# Patient Record
Sex: Male | Born: 1935 | Race: White | Hispanic: No | Smoking: Former smoker
Health system: Southern US, Community
[De-identification: ages and names within clinical notes are randomized; demographics above are authoritative.]

## PROBLEM LIST (undated history)

## (undated) DIAGNOSIS — E785 Hyperlipidemia, unspecified: Secondary | ICD-10-CM

## (undated) DIAGNOSIS — I10 Essential (primary) hypertension: Secondary | ICD-10-CM

## (undated) HISTORY — DX: Hyperlipidemia, unspecified: E78.5

## (undated) HISTORY — DX: Essential (primary) hypertension: I10

---

## 1998-02-09 ENCOUNTER — Encounter (HOSPITAL_COMMUNITY): Admission: RE | Admit: 1998-02-09 | Discharge: 1998-05-10 | Payer: Self-pay | Admitting: Nephrology

## 1998-05-25 ENCOUNTER — Encounter (HOSPITAL_COMMUNITY): Admission: RE | Admit: 1998-05-25 | Discharge: 1998-08-23 | Payer: Self-pay | Admitting: Nephrology

## 1998-08-24 ENCOUNTER — Encounter (HOSPITAL_COMMUNITY): Admission: RE | Admit: 1998-08-24 | Discharge: 1998-11-22 | Payer: Self-pay | Admitting: Nephrology

## 1998-09-15 ENCOUNTER — Encounter: Admission: RE | Admit: 1998-09-15 | Discharge: 1998-09-15 | Payer: Self-pay | Admitting: Sports Medicine

## 1998-12-14 ENCOUNTER — Encounter (HOSPITAL_COMMUNITY): Admission: RE | Admit: 1998-12-14 | Discharge: 1999-03-14 | Payer: Self-pay | Admitting: Nephrology

## 1999-03-22 ENCOUNTER — Encounter (HOSPITAL_COMMUNITY): Admission: RE | Admit: 1999-03-22 | Discharge: 1999-06-20 | Payer: Self-pay | Admitting: Nephrology

## 1999-04-13 ENCOUNTER — Encounter: Admission: RE | Admit: 1999-04-13 | Discharge: 1999-04-13 | Payer: Self-pay | Admitting: Family Medicine

## 1999-05-04 ENCOUNTER — Encounter: Admission: RE | Admit: 1999-05-04 | Discharge: 1999-05-04 | Payer: Self-pay | Admitting: Sports Medicine

## 1999-06-29 ENCOUNTER — Encounter (HOSPITAL_COMMUNITY): Admission: RE | Admit: 1999-06-29 | Discharge: 1999-09-27 | Payer: Self-pay | Admitting: Nephrology

## 1999-10-04 ENCOUNTER — Encounter (HOSPITAL_COMMUNITY): Admission: RE | Admit: 1999-10-04 | Discharge: 2000-01-02 | Payer: Self-pay | Admitting: Nephrology

## 1999-11-23 ENCOUNTER — Encounter: Admission: RE | Admit: 1999-11-23 | Discharge: 1999-11-23 | Payer: Self-pay | Admitting: Sports Medicine

## 1999-11-28 ENCOUNTER — Encounter: Admission: RE | Admit: 1999-11-28 | Discharge: 2000-02-26 | Payer: Self-pay | Admitting: Nephrology

## 2000-01-04 ENCOUNTER — Encounter (HOSPITAL_COMMUNITY): Admission: RE | Admit: 2000-01-04 | Discharge: 2000-04-03 | Payer: Self-pay | Admitting: Nephrology

## 2000-04-17 ENCOUNTER — Encounter (HOSPITAL_COMMUNITY): Admission: RE | Admit: 2000-04-17 | Discharge: 2000-07-16 | Payer: Self-pay | Admitting: Nephrology

## 2000-04-26 ENCOUNTER — Encounter: Admission: RE | Admit: 2000-04-26 | Discharge: 2000-07-25 | Payer: Self-pay | Admitting: Nephrology

## 2000-07-24 ENCOUNTER — Encounter (HOSPITAL_COMMUNITY): Admission: RE | Admit: 2000-07-24 | Discharge: 2000-10-22 | Payer: Self-pay | Admitting: Nephrology

## 2000-09-09 ENCOUNTER — Encounter: Admission: RE | Admit: 2000-09-09 | Discharge: 2000-09-09 | Payer: Self-pay | Admitting: Family Medicine

## 2000-10-10 ENCOUNTER — Encounter: Admission: RE | Admit: 2000-10-10 | Discharge: 2000-10-10 | Payer: Self-pay | Admitting: Sports Medicine

## 2000-10-11 ENCOUNTER — Encounter: Admission: RE | Admit: 2000-10-11 | Discharge: 2000-10-11 | Payer: Self-pay | Admitting: Family Medicine

## 2000-10-30 ENCOUNTER — Encounter (HOSPITAL_COMMUNITY): Admission: RE | Admit: 2000-10-30 | Discharge: 2001-01-28 | Payer: Self-pay | Admitting: Nephrology

## 2001-02-19 ENCOUNTER — Encounter (HOSPITAL_COMMUNITY): Admission: RE | Admit: 2001-02-19 | Discharge: 2001-05-20 | Payer: Self-pay | Admitting: Nephrology

## 2001-05-28 ENCOUNTER — Encounter (HOSPITAL_COMMUNITY): Admission: RE | Admit: 2001-05-28 | Discharge: 2001-06-04 | Payer: Self-pay | Admitting: Nephrology

## 2001-06-06 ENCOUNTER — Encounter (HOSPITAL_COMMUNITY): Admission: RE | Admit: 2001-06-06 | Discharge: 2001-09-04 | Payer: Self-pay | Admitting: Nephrology

## 2001-07-03 ENCOUNTER — Encounter: Admission: RE | Admit: 2001-07-03 | Discharge: 2001-07-03 | Payer: Self-pay | Admitting: Sports Medicine

## 2001-09-17 ENCOUNTER — Encounter (HOSPITAL_COMMUNITY): Admission: RE | Admit: 2001-09-17 | Discharge: 2001-12-16 | Payer: Self-pay | Admitting: Nephrology

## 2001-12-24 ENCOUNTER — Encounter (HOSPITAL_COMMUNITY): Admission: RE | Admit: 2001-12-24 | Discharge: 2002-03-24 | Payer: Self-pay | Admitting: Nephrology

## 2002-02-05 ENCOUNTER — Encounter: Admission: RE | Admit: 2002-02-05 | Discharge: 2002-02-05 | Payer: Self-pay | Admitting: Sports Medicine

## 2002-02-05 ENCOUNTER — Encounter: Admission: RE | Admit: 2002-02-05 | Discharge: 2002-02-05 | Payer: Self-pay | Admitting: Family Medicine

## 2002-03-05 ENCOUNTER — Encounter: Admission: RE | Admit: 2002-03-05 | Discharge: 2002-03-05 | Payer: Self-pay | Admitting: Sports Medicine

## 2002-04-01 ENCOUNTER — Encounter (HOSPITAL_COMMUNITY): Admission: RE | Admit: 2002-04-01 | Discharge: 2002-06-30 | Payer: Self-pay | Admitting: Nephrology

## 2002-06-25 ENCOUNTER — Encounter: Admission: RE | Admit: 2002-06-25 | Discharge: 2002-06-25 | Payer: Self-pay | Admitting: Sports Medicine

## 2002-07-08 ENCOUNTER — Encounter (HOSPITAL_COMMUNITY): Admission: RE | Admit: 2002-07-08 | Discharge: 2002-10-06 | Payer: Self-pay | Admitting: Nephrology

## 2002-07-30 ENCOUNTER — Encounter: Admission: RE | Admit: 2002-07-30 | Discharge: 2002-07-30 | Payer: Self-pay | Admitting: Family Medicine

## 2002-07-31 ENCOUNTER — Encounter: Admission: RE | Admit: 2002-07-31 | Discharge: 2002-07-31 | Payer: Self-pay | Admitting: Sports Medicine

## 2002-09-01 ENCOUNTER — Encounter: Admission: RE | Admit: 2002-09-01 | Discharge: 2002-09-01 | Payer: Self-pay | Admitting: Sports Medicine

## 2002-10-14 ENCOUNTER — Encounter (HOSPITAL_COMMUNITY): Admission: RE | Admit: 2002-10-14 | Discharge: 2003-01-12 | Payer: Self-pay | Admitting: Nephrology

## 2002-11-26 ENCOUNTER — Encounter: Admission: RE | Admit: 2002-11-26 | Discharge: 2002-11-26 | Payer: Self-pay | Admitting: Family Medicine

## 2003-01-27 ENCOUNTER — Encounter (HOSPITAL_COMMUNITY): Admission: RE | Admit: 2003-01-27 | Discharge: 2003-04-27 | Payer: Self-pay | Admitting: Nephrology

## 2003-04-01 ENCOUNTER — Encounter: Admission: RE | Admit: 2003-04-01 | Discharge: 2003-04-01 | Payer: Self-pay | Admitting: Sports Medicine

## 2003-05-12 ENCOUNTER — Encounter (HOSPITAL_COMMUNITY): Admission: RE | Admit: 2003-05-12 | Discharge: 2003-08-10 | Payer: Self-pay | Admitting: Nephrology

## 2003-07-02 ENCOUNTER — Encounter: Admission: RE | Admit: 2003-07-02 | Discharge: 2003-07-02 | Payer: Self-pay | Admitting: Sports Medicine

## 2003-08-25 ENCOUNTER — Encounter (HOSPITAL_COMMUNITY): Admission: RE | Admit: 2003-08-25 | Discharge: 2003-11-23 | Payer: Self-pay | Admitting: Nephrology

## 2003-10-20 ENCOUNTER — Encounter: Admission: RE | Admit: 2003-10-20 | Discharge: 2003-10-20 | Payer: Self-pay | Admitting: Family Medicine

## 2003-11-04 ENCOUNTER — Encounter: Admission: RE | Admit: 2003-11-04 | Discharge: 2003-11-04 | Payer: Self-pay | Admitting: Family Medicine

## 2003-12-01 ENCOUNTER — Encounter: Admission: RE | Admit: 2003-12-01 | Discharge: 2003-12-01 | Payer: Self-pay | Admitting: Sports Medicine

## 2003-12-02 ENCOUNTER — Encounter: Admission: RE | Admit: 2003-12-02 | Discharge: 2003-12-02 | Payer: Self-pay | Admitting: Sports Medicine

## 2003-12-07 ENCOUNTER — Encounter (HOSPITAL_COMMUNITY): Admission: RE | Admit: 2003-12-07 | Discharge: 2004-03-06 | Payer: Self-pay | Admitting: Nephrology

## 2003-12-17 ENCOUNTER — Encounter: Admission: RE | Admit: 2003-12-17 | Discharge: 2003-12-17 | Payer: Self-pay | Admitting: Family Medicine

## 2004-03-22 ENCOUNTER — Encounter (HOSPITAL_COMMUNITY): Admission: RE | Admit: 2004-03-22 | Discharge: 2004-06-20 | Payer: Self-pay | Admitting: Nephrology

## 2004-07-07 ENCOUNTER — Encounter (HOSPITAL_COMMUNITY): Admission: RE | Admit: 2004-07-07 | Discharge: 2004-10-05 | Payer: Self-pay | Admitting: Nephrology

## 2004-08-10 ENCOUNTER — Ambulatory Visit: Payer: Self-pay | Admitting: Sports Medicine

## 2004-08-22 ENCOUNTER — Ambulatory Visit: Payer: Self-pay | Admitting: Sports Medicine

## 2004-10-05 ENCOUNTER — Ambulatory Visit: Payer: Self-pay | Admitting: Sports Medicine

## 2004-10-18 ENCOUNTER — Encounter (HOSPITAL_COMMUNITY): Admission: RE | Admit: 2004-10-18 | Discharge: 2005-01-16 | Payer: Self-pay | Admitting: Nephrology

## 2004-11-30 ENCOUNTER — Ambulatory Visit: Payer: Self-pay | Admitting: Sports Medicine

## 2005-01-31 ENCOUNTER — Encounter (HOSPITAL_COMMUNITY): Admission: RE | Admit: 2005-01-31 | Discharge: 2005-05-01 | Payer: Self-pay | Admitting: Nephrology

## 2005-05-16 ENCOUNTER — Encounter (HOSPITAL_COMMUNITY): Admission: RE | Admit: 2005-05-16 | Discharge: 2005-08-14 | Payer: Self-pay | Admitting: Nephrology

## 2005-08-29 ENCOUNTER — Encounter (HOSPITAL_COMMUNITY): Admission: RE | Admit: 2005-08-29 | Discharge: 2005-11-27 | Payer: Self-pay | Admitting: Nephrology

## 2005-08-31 ENCOUNTER — Ambulatory Visit: Payer: Self-pay | Admitting: Sports Medicine

## 2005-10-25 ENCOUNTER — Ambulatory Visit: Payer: Self-pay | Admitting: Sports Medicine

## 2005-12-19 ENCOUNTER — Encounter (HOSPITAL_COMMUNITY): Admission: RE | Admit: 2005-12-19 | Discharge: 2006-03-19 | Payer: Self-pay | Admitting: Nephrology

## 2006-04-10 ENCOUNTER — Encounter (HOSPITAL_COMMUNITY): Admission: RE | Admit: 2006-04-10 | Discharge: 2006-07-09 | Payer: Self-pay | Admitting: Nephrology

## 2006-07-11 ENCOUNTER — Ambulatory Visit: Payer: Self-pay | Admitting: Family Medicine

## 2006-07-31 ENCOUNTER — Encounter (HOSPITAL_COMMUNITY): Admission: RE | Admit: 2006-07-31 | Discharge: 2006-10-28 | Payer: Self-pay | Admitting: Nephrology

## 2006-08-01 ENCOUNTER — Ambulatory Visit: Payer: Self-pay | Admitting: Sports Medicine

## 2006-08-22 ENCOUNTER — Ambulatory Visit: Payer: Self-pay | Admitting: Sports Medicine

## 2006-09-05 ENCOUNTER — Ambulatory Visit: Payer: Self-pay | Admitting: Sports Medicine

## 2006-09-12 ENCOUNTER — Ambulatory Visit: Payer: Self-pay | Admitting: Sports Medicine

## 2006-10-17 ENCOUNTER — Ambulatory Visit: Payer: Self-pay | Admitting: Sports Medicine

## 2006-10-18 ENCOUNTER — Ambulatory Visit (HOSPITAL_COMMUNITY): Admission: RE | Admit: 2006-10-18 | Discharge: 2006-10-18 | Payer: Self-pay | Admitting: Nephrology

## 2006-10-21 ENCOUNTER — Ambulatory Visit (HOSPITAL_COMMUNITY): Admission: RE | Admit: 2006-10-21 | Discharge: 2006-10-21 | Payer: Self-pay | Admitting: Nephrology

## 2006-10-30 ENCOUNTER — Encounter (HOSPITAL_COMMUNITY): Admission: RE | Admit: 2006-10-30 | Discharge: 2007-01-28 | Payer: Self-pay | Admitting: Nephrology

## 2006-12-19 ENCOUNTER — Ambulatory Visit: Payer: Self-pay | Admitting: Sports Medicine

## 2006-12-19 ENCOUNTER — Encounter: Admission: RE | Admit: 2006-12-19 | Discharge: 2006-12-19 | Payer: Self-pay | Admitting: Sports Medicine

## 2007-01-02 DIAGNOSIS — I2510 Atherosclerotic heart disease of native coronary artery without angina pectoris: Secondary | ICD-10-CM | POA: Insufficient documentation

## 2007-01-02 DIAGNOSIS — E785 Hyperlipidemia, unspecified: Secondary | ICD-10-CM | POA: Insufficient documentation

## 2007-01-02 DIAGNOSIS — N19 Unspecified kidney failure: Secondary | ICD-10-CM | POA: Insufficient documentation

## 2007-01-02 DIAGNOSIS — I739 Peripheral vascular disease, unspecified: Secondary | ICD-10-CM

## 2007-01-02 DIAGNOSIS — N529 Male erectile dysfunction, unspecified: Secondary | ICD-10-CM

## 2007-01-02 DIAGNOSIS — I10 Essential (primary) hypertension: Secondary | ICD-10-CM

## 2007-01-29 ENCOUNTER — Encounter (HOSPITAL_COMMUNITY): Admission: RE | Admit: 2007-01-29 | Discharge: 2007-04-29 | Payer: Self-pay | Admitting: Nephrology

## 2007-02-03 ENCOUNTER — Encounter: Payer: Self-pay | Admitting: *Deleted

## 2007-02-03 ENCOUNTER — Telehealth: Payer: Self-pay | Admitting: Sports Medicine

## 2007-02-07 ENCOUNTER — Encounter: Payer: Self-pay | Admitting: Sports Medicine

## 2007-02-13 ENCOUNTER — Ambulatory Visit: Payer: Self-pay | Admitting: Sports Medicine

## 2007-02-13 DIAGNOSIS — M1710 Unilateral primary osteoarthritis, unspecified knee: Secondary | ICD-10-CM

## 2007-02-13 DIAGNOSIS — B029 Zoster without complications: Secondary | ICD-10-CM | POA: Insufficient documentation

## 2007-04-07 ENCOUNTER — Telehealth: Payer: Self-pay | Admitting: *Deleted

## 2007-05-07 ENCOUNTER — Encounter (HOSPITAL_COMMUNITY): Admission: RE | Admit: 2007-05-07 | Discharge: 2007-08-05 | Payer: Self-pay | Admitting: Nephrology

## 2007-05-23 ENCOUNTER — Encounter: Admission: RE | Admit: 2007-05-23 | Discharge: 2007-05-23 | Payer: Self-pay | Admitting: Nephrology

## 2007-06-05 ENCOUNTER — Ambulatory Visit: Payer: Self-pay | Admitting: Sports Medicine

## 2007-06-09 ENCOUNTER — Encounter: Payer: Self-pay | Admitting: Sports Medicine

## 2007-06-19 ENCOUNTER — Telehealth: Payer: Self-pay | Admitting: Sports Medicine

## 2007-07-17 ENCOUNTER — Encounter: Payer: Self-pay | Admitting: Sports Medicine

## 2007-07-28 ENCOUNTER — Telehealth: Payer: Self-pay | Admitting: Sports Medicine

## 2007-08-01 ENCOUNTER — Encounter (INDEPENDENT_AMBULATORY_CARE_PROVIDER_SITE_OTHER): Payer: Self-pay | Admitting: Family Medicine

## 2007-08-01 ENCOUNTER — Telehealth: Payer: Self-pay | Admitting: *Deleted

## 2007-08-01 ENCOUNTER — Ambulatory Visit: Payer: Self-pay | Admitting: Family Medicine

## 2007-08-01 ENCOUNTER — Encounter: Payer: Self-pay | Admitting: Sports Medicine

## 2007-08-01 DIAGNOSIS — R1013 Epigastric pain: Secondary | ICD-10-CM

## 2007-08-01 DIAGNOSIS — R112 Nausea with vomiting, unspecified: Secondary | ICD-10-CM | POA: Insufficient documentation

## 2007-08-01 LAB — CONVERTED CEMR LAB: H Pylori IgG: NEGATIVE

## 2007-08-02 LAB — CONVERTED CEMR LAB
ALT: 8 units/L (ref 0–53)
AST: 11 units/L (ref 0–37)
Albumin: 4 g/dL (ref 3.5–5.2)
Alkaline Phosphatase: 100 units/L (ref 39–117)
BUN: 25 mg/dL — ABNORMAL HIGH (ref 6–23)
Basophils Absolute: 0 10*3/uL (ref 0.0–0.1)
Basophils Relative: 0 % (ref 0–1)
Bilirubin, Direct: <0.1 mg/dL (ref 0.0–0.3)
CO2: 28 meq/L (ref 19–32)
Calcium: 9.4 mg/dL (ref 8.4–10.5)
Chloride: 100 meq/L (ref 96–112)
Creatinine, Ser: 2.43 mg/dL — ABNORMAL HIGH (ref 0.40–1.50)
Eosinophils Absolute: 0.1 10*3/uL (ref 0.0–0.7)
Eosinophils Relative: 1 % (ref 0–5)
Glucose, Bld: 101 mg/dL — ABNORMAL HIGH (ref 70–99)
HCT: 32.5 % — ABNORMAL LOW (ref 39.0–52.0)
Hemoglobin: 9.5 g/dL — ABNORMAL LOW (ref 13.0–17.0)
Lipase: 12 units/L (ref 0–75)
Lymphocytes Relative: 11 % — ABNORMAL LOW (ref 12–46)
Lymphs Abs: 1.2 10*3/uL (ref 0.7–3.3)
MCHC: 29.2 g/dL — ABNORMAL LOW (ref 30.0–36.0)
MCV: 99.4 fL (ref 78.0–100.0)
Monocytes Absolute: 0.7 10*3/uL (ref 0.2–0.7)
Monocytes Relative: 6 % (ref 3–11)
Neutro Abs: 8.6 10*3/uL — ABNORMAL HIGH (ref 1.7–7.7)
Neutrophils Relative %: 82 % — ABNORMAL HIGH (ref 43–77)
Platelets: 573 10*3/uL — ABNORMAL HIGH (ref 150–400)
Potassium: 3.9 meq/L (ref 3.5–5.3)
RBC: 3.27 M/uL — ABNORMAL LOW (ref 4.22–5.81)
RDW: 16.2 % — ABNORMAL HIGH (ref 11.5–14.0)
Sodium: 143 meq/L (ref 135–145)
Total Bilirubin: 0.3 mg/dL (ref 0.3–1.2)
Total Protein: 7.2 g/dL (ref 6.0–8.3)
WBC: 10.5 10*3/uL (ref 4.0–10.5)

## 2007-08-12 ENCOUNTER — Encounter: Payer: Self-pay | Admitting: Sports Medicine

## 2007-08-12 ENCOUNTER — Encounter (HOSPITAL_COMMUNITY): Admission: RE | Admit: 2007-08-12 | Discharge: 2007-11-10 | Payer: Self-pay | Admitting: Nephrology

## 2007-08-14 ENCOUNTER — Ambulatory Visit: Payer: Self-pay | Admitting: Sports Medicine

## 2007-09-01 ENCOUNTER — Telehealth: Payer: Self-pay | Admitting: Sports Medicine

## 2007-09-16 ENCOUNTER — Encounter: Payer: Self-pay | Admitting: Sports Medicine

## 2007-11-19 ENCOUNTER — Encounter (HOSPITAL_COMMUNITY): Admission: RE | Admit: 2007-11-19 | Discharge: 2008-02-17 | Payer: Self-pay | Admitting: Nephrology

## 2007-11-27 ENCOUNTER — Ambulatory Visit: Payer: Self-pay | Admitting: Sports Medicine

## 2007-12-04 ENCOUNTER — Ambulatory Visit: Payer: Self-pay | Admitting: Sports Medicine

## 2007-12-15 ENCOUNTER — Telehealth: Payer: Self-pay | Admitting: *Deleted

## 2007-12-30 ENCOUNTER — Encounter: Payer: Self-pay | Admitting: Sports Medicine

## 2008-01-01 ENCOUNTER — Telehealth: Payer: Self-pay | Admitting: Sports Medicine

## 2008-01-08 ENCOUNTER — Ambulatory Visit: Payer: Self-pay | Admitting: Sports Medicine

## 2008-01-08 DIAGNOSIS — G5600 Carpal tunnel syndrome, unspecified upper limb: Secondary | ICD-10-CM

## 2008-02-10 IMAGING — CR DG KNEE COMPLETE 4+V*R*
4 series · 4 of 4 positions shown · non-contrast
Comparison: [REDACTED], right knee radiographs, 12/02/03.

CLINICAL DATA: Three weeks post fall injury with continued right knee pain and swelling.   Left knee pain.  No injury.
DIAGNOSTIC KNEE COMPLETE - 4 VIEW:

[w knee ap right]
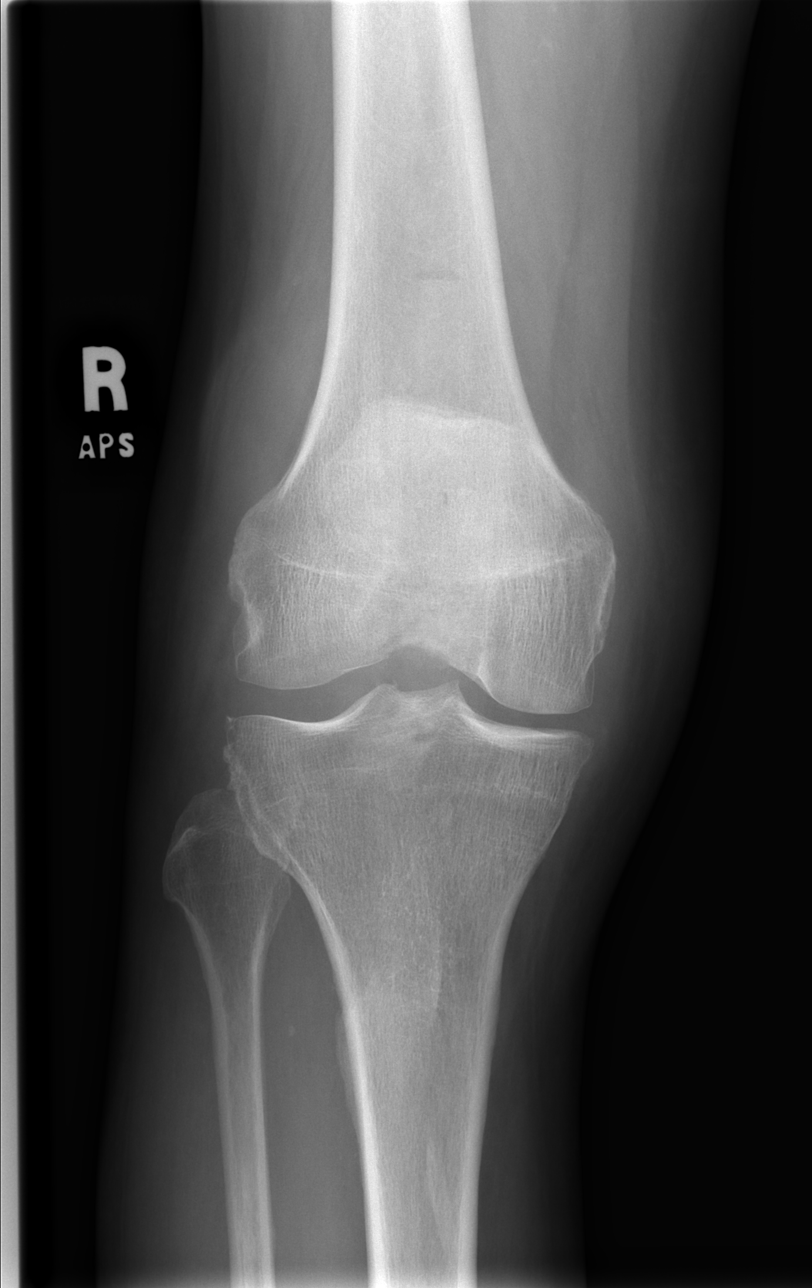

[w knee obl. right (1 of 2)]
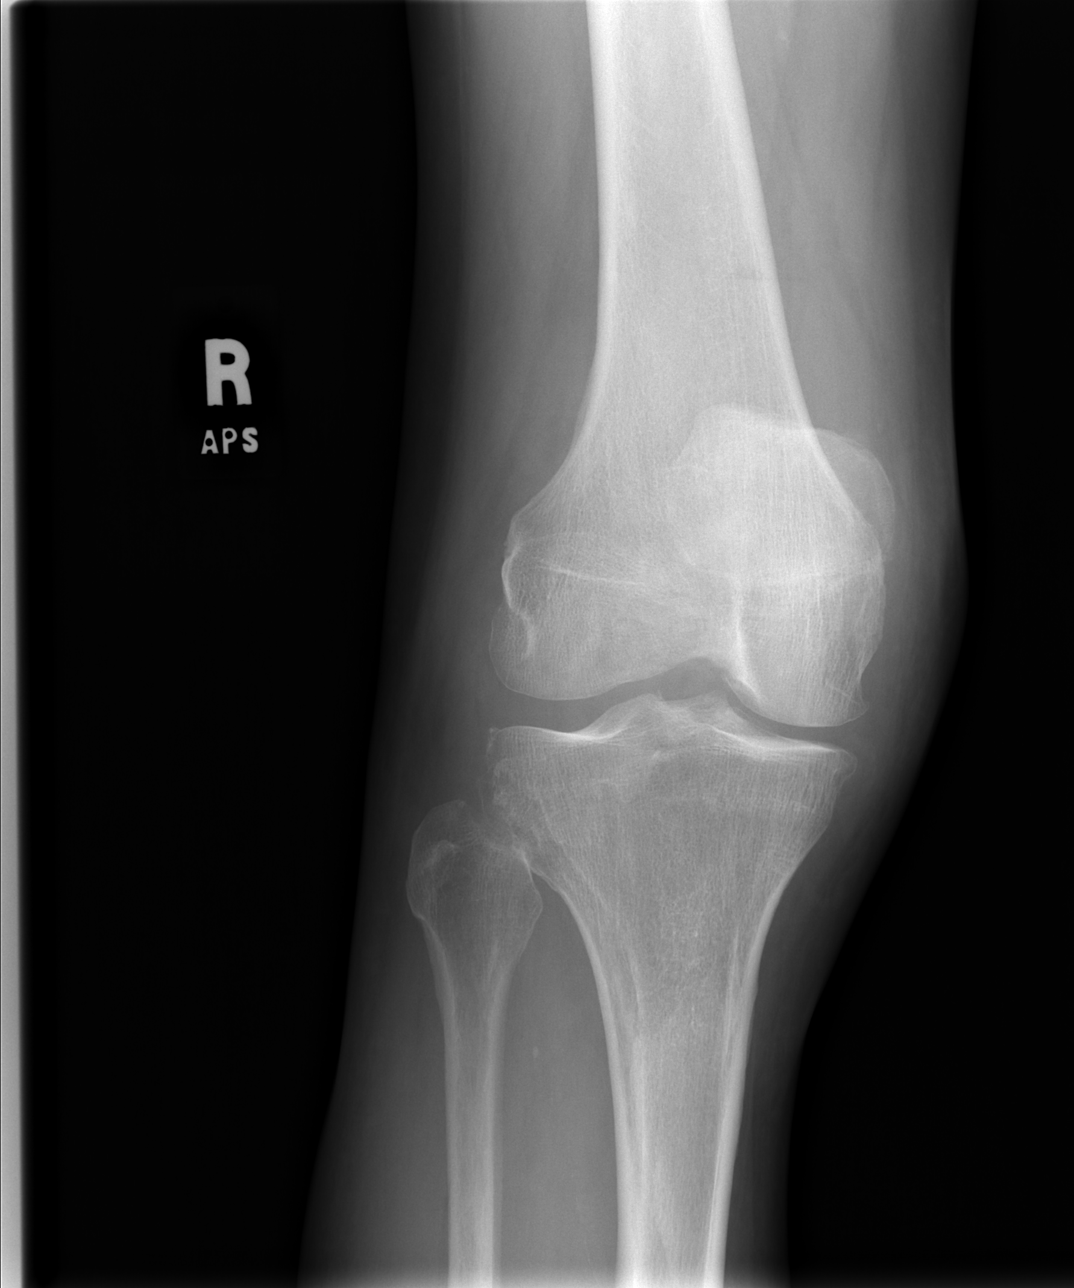

[w knee obl. right (2 of 2)]
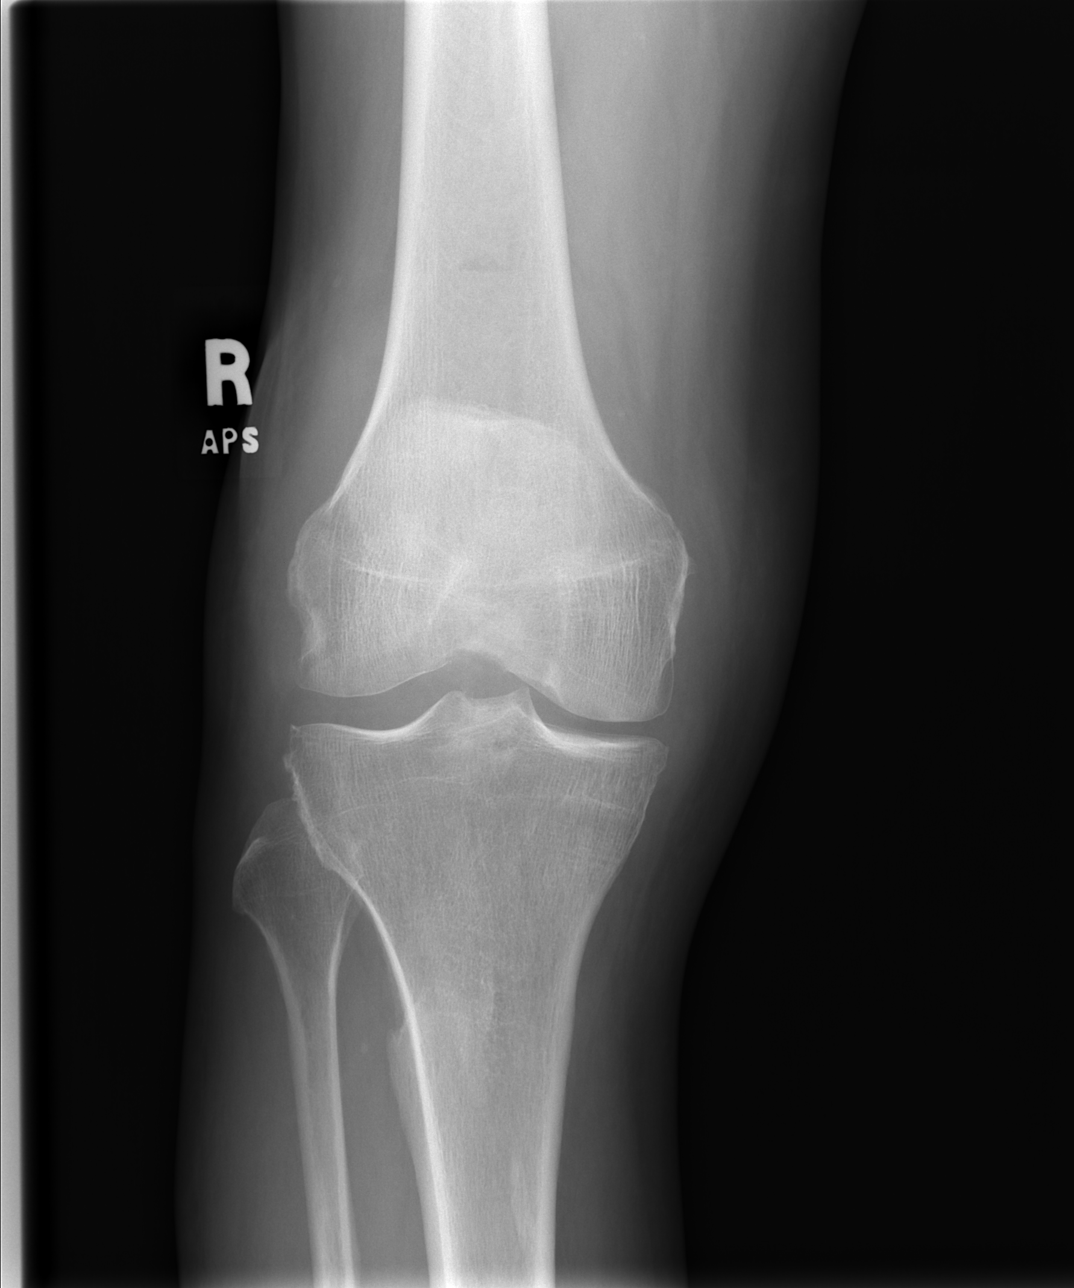

[w knee lat. right]
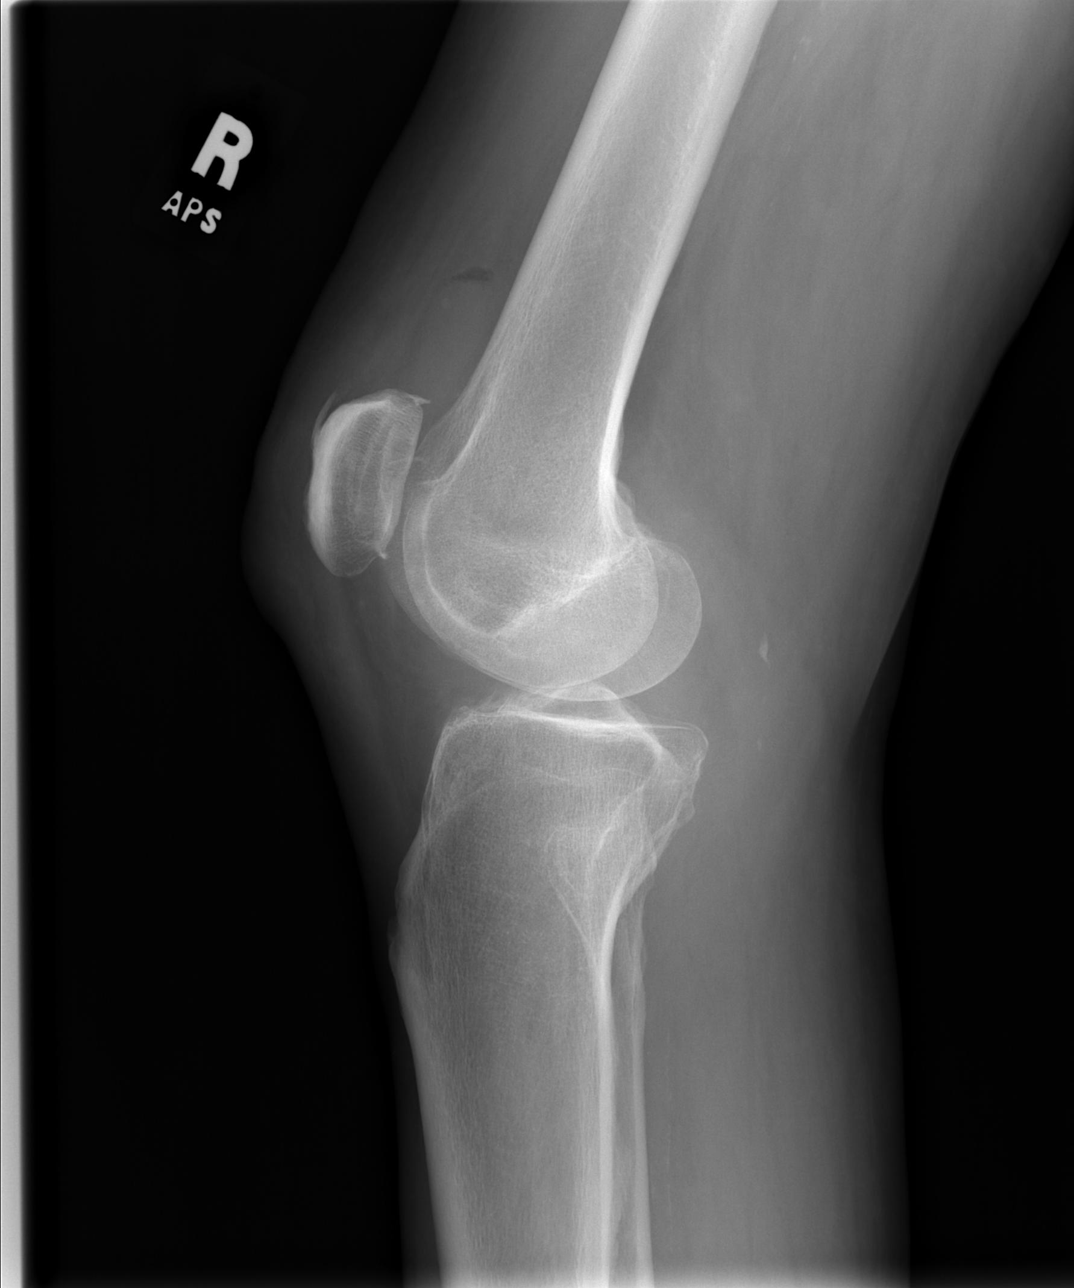

[4 of 4 positions shown; findings below may reference images not displayed]

FINDINGS: Slightly greater small suprapatellar bursal effusion is noted with small fat fluid level consistent with radiographically occult intra-articular fracture.  No fracture is visualized with slightly progressive degenerative medial compartment narrowing, chronic appearing small subchondral defect at the intercondylar notch distal medial femoral condyle and stable small patellar osteophytes.  Stable slight vascular calcification is seen.   Prepatellar bursal swelling suggests post traumatic bursitis.
IMPRESSION: 1.  Small suprapatellar bursal effusion (greater than prior study) with fat fluid level suggesting radiographically occult intra-articular fracture. If clinically indicated, MRI may better evaluate for bone contusion or CT since findings are suspicious for radiographically occult fracture, post trauma.
2.  Interval chronic appearing small subchondral degenerative change at the medial aspect distal medial femoral condyle.
3.  Progressive degenerative joint space narrowing, medial compartment with stable degenerative changes, patellofemoral compartment. 
4.  Probable prepatellar post traumatic bursitis.
5.  Stable atheromatous vascular calcification. 

STANDING LEFT KNEE - 2 VIEW:
No comparison.
FINDINGS: Small suprapatellar bursal effusion is seen.  Slight medial compartment and patellofemoral compartment degenerative change is noted.  Slight atheromatous vascular calcification is seen.  No other significant abnormality noted.
IMPRESSION: 1.  Slight patellofemoral and medial compartment degenerative change.
2.  Slight suprapatellar bursal effusion.
3.  Slight vascular calcification. 
4.  Otherwise no significant abnormality.

## 2008-02-18 ENCOUNTER — Encounter (HOSPITAL_COMMUNITY): Admission: RE | Admit: 2008-02-18 | Discharge: 2008-05-18 | Payer: Self-pay | Admitting: Nephrology

## 2008-04-15 ENCOUNTER — Ambulatory Visit: Payer: Self-pay | Admitting: Sports Medicine

## 2008-04-15 ENCOUNTER — Encounter: Admission: RE | Admit: 2008-04-15 | Discharge: 2008-04-15 | Payer: Self-pay | Admitting: Sports Medicine

## 2008-04-15 DIAGNOSIS — M19019 Primary osteoarthritis, unspecified shoulder: Secondary | ICD-10-CM | POA: Insufficient documentation

## 2008-04-16 ENCOUNTER — Telehealth: Payer: Self-pay | Admitting: *Deleted

## 2008-05-04 ENCOUNTER — Ambulatory Visit: Payer: Self-pay | Admitting: Sports Medicine

## 2008-05-13 ENCOUNTER — Encounter: Payer: Self-pay | Admitting: Sports Medicine

## 2008-05-26 ENCOUNTER — Encounter (HOSPITAL_COMMUNITY): Admission: RE | Admit: 2008-05-26 | Discharge: 2008-08-24 | Payer: Self-pay | Admitting: Nephrology

## 2008-06-24 ENCOUNTER — Ambulatory Visit: Payer: Self-pay | Admitting: Family Medicine

## 2008-06-24 ENCOUNTER — Encounter: Payer: Self-pay | Admitting: Sports Medicine

## 2008-06-24 LAB — CONVERTED CEMR LAB: Glucose, Bld: 92 mg/dL (ref 70–99)

## 2008-07-14 IMAGING — US US RENAL
1 series · 14 of 25 positions shown · non-contrast
Comparison: 10/18/06.

CLINICAL DATA: Increased creatinine.  Question obstruction.
 RENAL/URINARY TRACT ULTRASOUND ? 05/23/07:
TECHNIQUE: Complete ultrasound examination of the urinary tract was performed including evaluation of the kidneys, renal collecting systems, and urinary bladder.

[Series 1: us renal · 0.26mm/px · 14 of 40 slices shown]
[im 1/40]
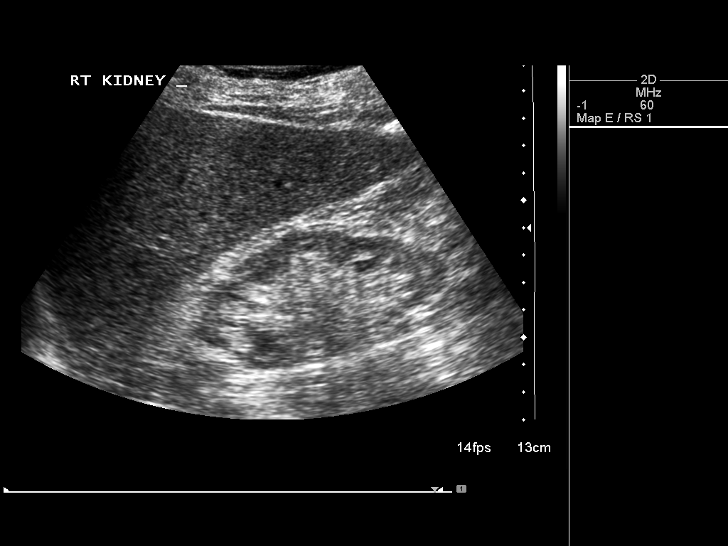
[im 4/40]
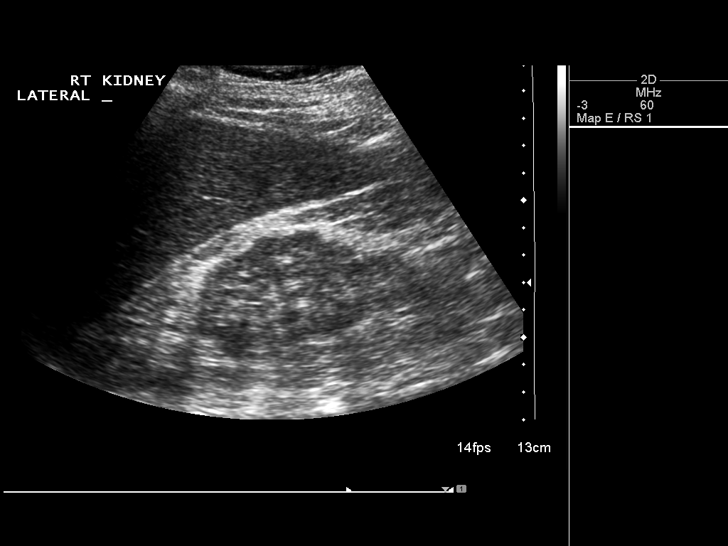
[im 7/40]
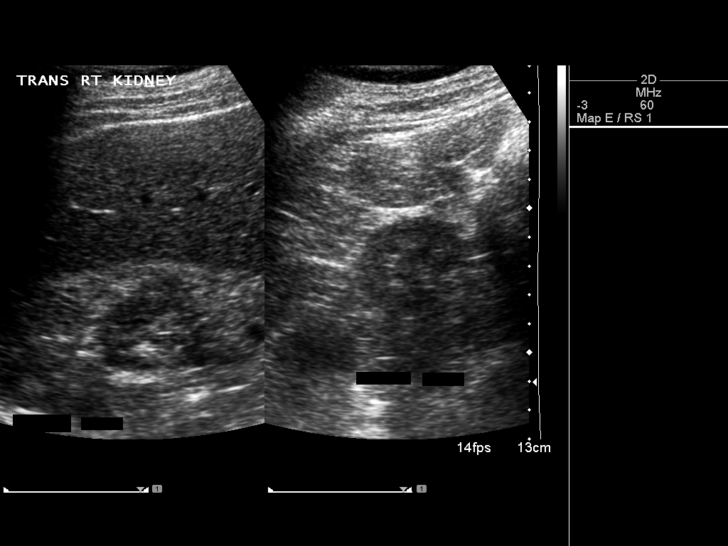
[im 10/40]
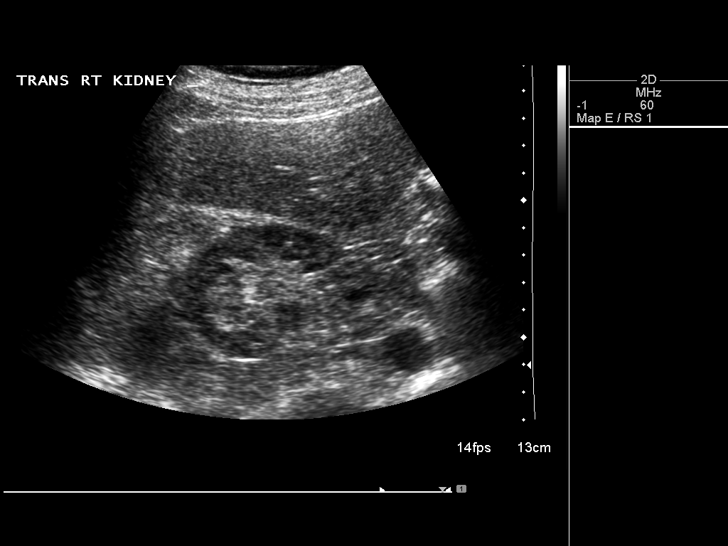
[im 14/40]
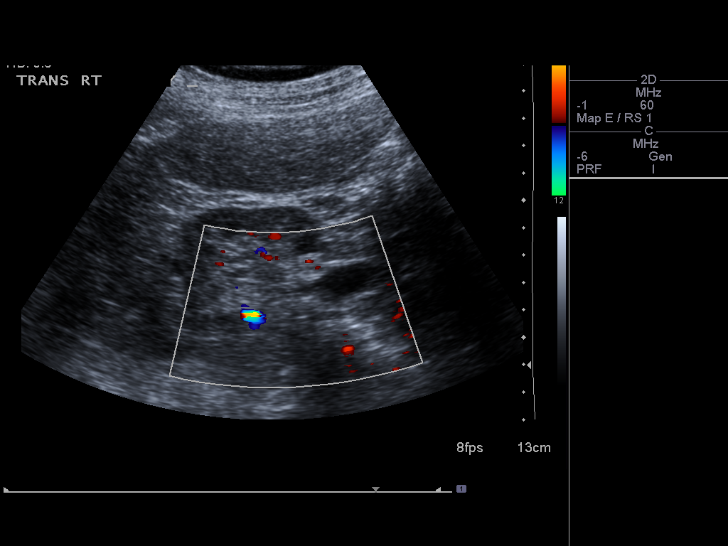
[im 15/40]
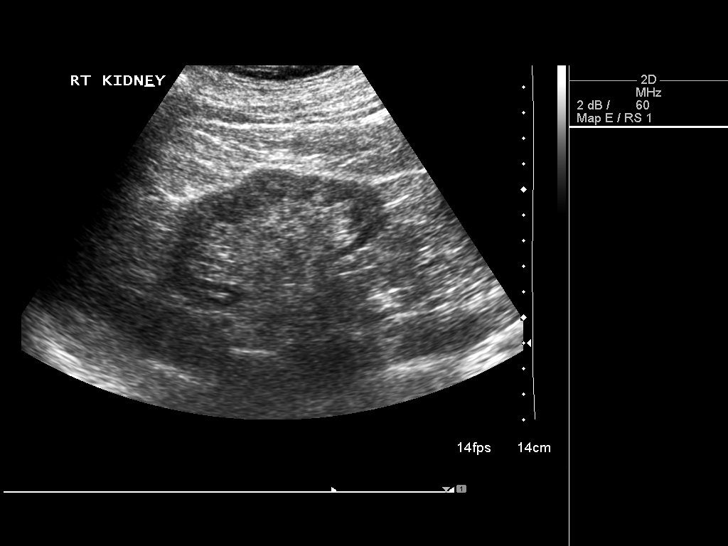
[im 18/40]
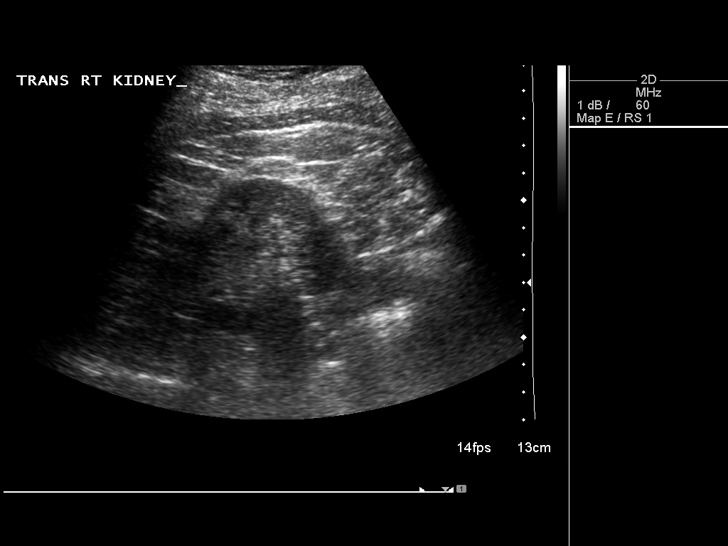
[im 22/40]
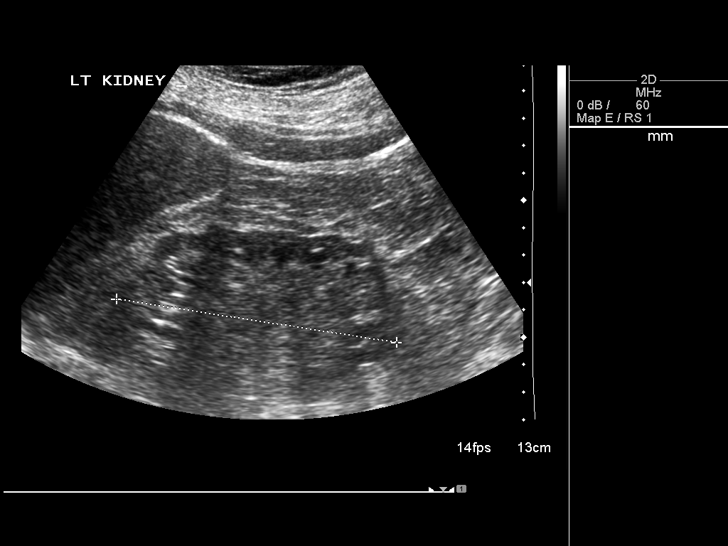
[im 25/40]
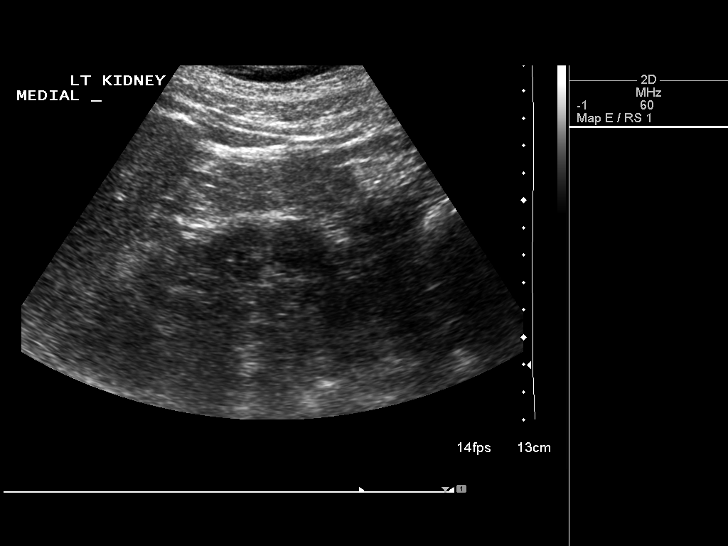
[im 27/40]
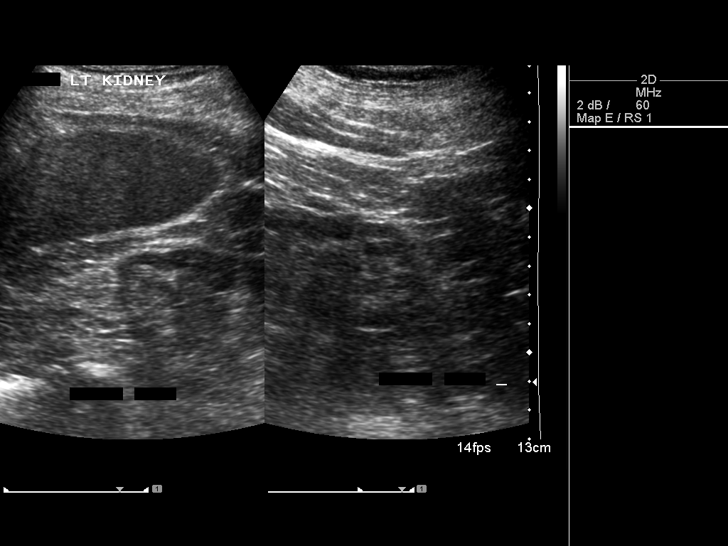
[im 30/40]
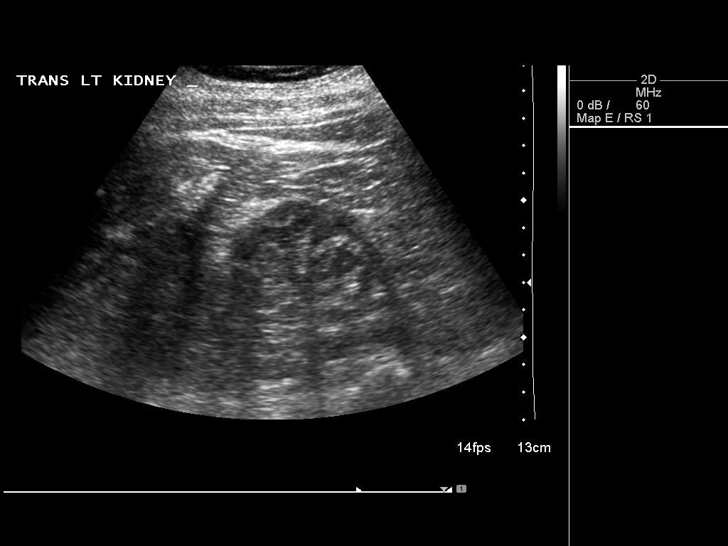
[im 33/40]
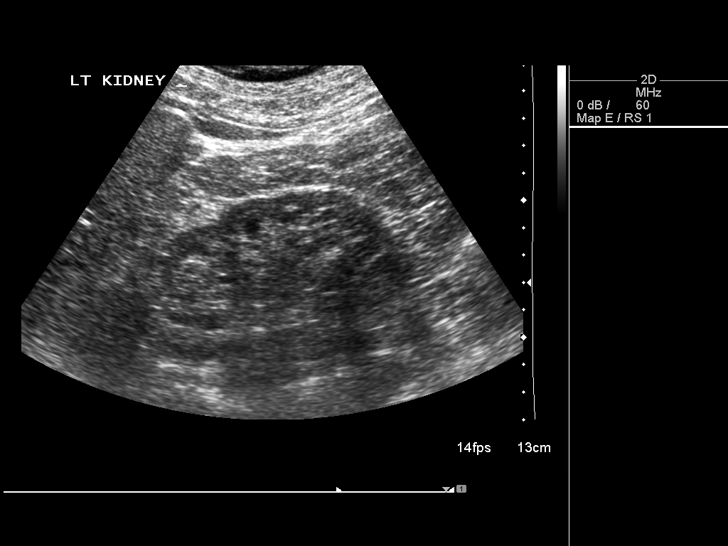
[im 36/40]
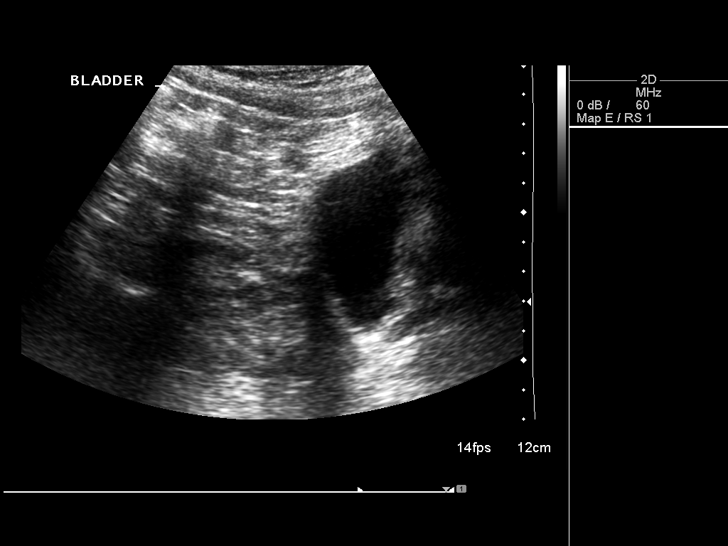
[im 40/40]
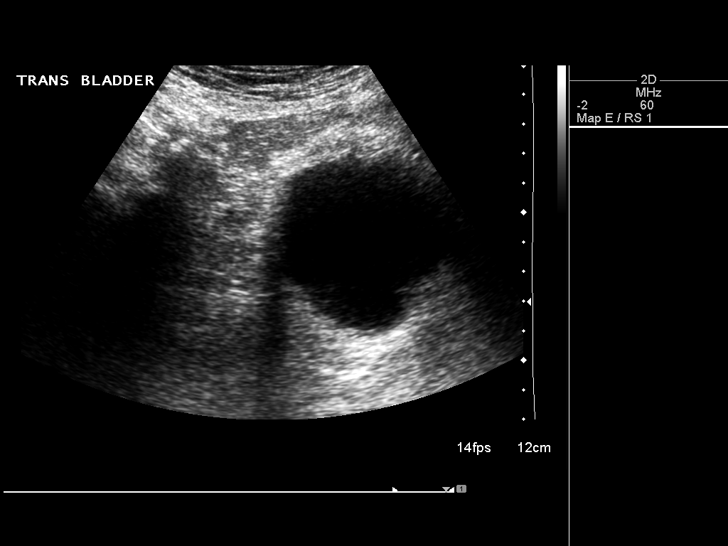

[14 of 25 positions shown; findings below may reference images not displayed]

FINDINGS: The right kidney measures 10.1 cm and contains a 1.8 x 1.5 x 1.7 cm anechoic lesion with increased through transmission in the renal sinus.  The left kidney measures 10.3 cm.  Parenchymal echotexture is uniform.  Mild cortical thinning.  No hydronephrosis.  The bladder is unremarkable.
IMPRESSION: Parenchymal thinning.  Right parapelvic cyst.

## 2008-08-03 ENCOUNTER — Ambulatory Visit: Payer: Self-pay | Admitting: Sports Medicine

## 2008-08-03 DIAGNOSIS — M79609 Pain in unspecified limb: Secondary | ICD-10-CM | POA: Insufficient documentation

## 2008-08-19 ENCOUNTER — Encounter: Payer: Self-pay | Admitting: Sports Medicine

## 2008-09-01 ENCOUNTER — Encounter (HOSPITAL_COMMUNITY): Admission: RE | Admit: 2008-09-01 | Discharge: 2008-11-04 | Payer: Self-pay | Admitting: Nephrology

## 2008-09-01 ENCOUNTER — Ambulatory Visit: Payer: Self-pay | Admitting: Family Medicine

## 2008-09-09 ENCOUNTER — Encounter: Payer: Self-pay | Admitting: Family Medicine

## 2008-10-13 ENCOUNTER — Ambulatory Visit (HOSPITAL_COMMUNITY): Admission: RE | Admit: 2008-10-13 | Discharge: 2008-10-13 | Payer: Self-pay | Admitting: Rheumatology

## 2008-11-05 ENCOUNTER — Encounter (HOSPITAL_COMMUNITY): Admission: RE | Admit: 2008-11-05 | Discharge: 2009-02-03 | Payer: Self-pay | Admitting: Nephrology

## 2008-12-02 ENCOUNTER — Encounter: Payer: Self-pay | Admitting: Family Medicine

## 2008-12-15 ENCOUNTER — Encounter: Payer: Self-pay | Admitting: Family Medicine

## 2009-01-27 ENCOUNTER — Ambulatory Visit: Payer: Self-pay | Admitting: Sports Medicine

## 2009-01-27 DIAGNOSIS — M109 Gout, unspecified: Secondary | ICD-10-CM

## 2009-02-08 ENCOUNTER — Telehealth: Payer: Self-pay | Admitting: *Deleted

## 2009-02-14 ENCOUNTER — Encounter: Payer: Self-pay | Admitting: *Deleted

## 2009-02-16 ENCOUNTER — Encounter: Payer: Self-pay | Admitting: Family Medicine

## 2009-02-16 ENCOUNTER — Encounter (HOSPITAL_COMMUNITY): Admission: RE | Admit: 2009-02-16 | Discharge: 2009-05-17 | Payer: Self-pay | Admitting: Nephrology

## 2009-03-30 ENCOUNTER — Encounter: Payer: Self-pay | Admitting: Family Medicine

## 2009-05-25 ENCOUNTER — Encounter (HOSPITAL_COMMUNITY): Admission: RE | Admit: 2009-05-25 | Discharge: 2009-08-23 | Payer: Self-pay | Admitting: Nephrology

## 2009-06-22 ENCOUNTER — Encounter: Payer: Self-pay | Admitting: Family Medicine

## 2009-08-31 ENCOUNTER — Encounter (HOSPITAL_COMMUNITY): Admission: RE | Admit: 2009-08-31 | Discharge: 2009-11-29 | Payer: Self-pay | Admitting: Nephrology

## 2009-09-07 ENCOUNTER — Ambulatory Visit: Payer: Self-pay | Admitting: Family Medicine

## 2009-10-11 ENCOUNTER — Encounter: Payer: Self-pay | Admitting: Family Medicine

## 2009-12-07 ENCOUNTER — Encounter (HOSPITAL_COMMUNITY): Admission: RE | Admit: 2009-12-07 | Discharge: 2010-03-07 | Payer: Self-pay | Admitting: Nephrology

## 2009-12-08 ENCOUNTER — Telehealth: Payer: Self-pay | Admitting: Family Medicine

## 2010-02-10 ENCOUNTER — Encounter: Payer: Self-pay | Admitting: Family Medicine

## 2010-03-15 ENCOUNTER — Encounter (HOSPITAL_COMMUNITY): Admission: RE | Admit: 2010-03-15 | Discharge: 2010-06-13 | Payer: Self-pay | Admitting: Nephrology

## 2010-06-20 ENCOUNTER — Encounter: Payer: Self-pay | Admitting: Family Medicine

## 2010-06-21 ENCOUNTER — Encounter (HOSPITAL_COMMUNITY): Admission: RE | Admit: 2010-06-21 | Discharge: 2010-09-19 | Payer: Self-pay | Admitting: Nephrology

## 2010-06-30 ENCOUNTER — Ambulatory Visit: Payer: Self-pay | Admitting: Family Medicine

## 2010-06-30 DIAGNOSIS — N039 Chronic nephritic syndrome with unspecified morphologic changes: Secondary | ICD-10-CM | POA: Insufficient documentation

## 2010-06-30 DIAGNOSIS — D485 Neoplasm of uncertain behavior of skin: Secondary | ICD-10-CM

## 2010-06-30 LAB — CONVERTED CEMR LAB: Direct LDL: 85 mg/dL

## 2010-07-05 ENCOUNTER — Encounter: Payer: Self-pay | Admitting: Family Medicine

## 2010-08-08 ENCOUNTER — Ambulatory Visit: Payer: Self-pay | Admitting: Family Medicine

## 2010-09-27 ENCOUNTER — Encounter
Admission: RE | Admit: 2010-09-27 | Discharge: 2010-12-05 | Payer: Self-pay | Source: Home / Self Care | Attending: Nephrology | Admitting: Nephrology

## 2010-10-10 ENCOUNTER — Encounter: Payer: Self-pay | Admitting: Family Medicine

## 2010-11-08 LAB — IRON AND TIBC
Iron: 75 ug/dL (ref 42–135)
Saturation Ratios: 28 % (ref 20–55)
TIBC: 270 ug/dL (ref 215–435)
UIBC: 195 ug/dL

## 2010-11-08 LAB — FERRITIN: Ferritin: 106 ng/mL (ref 22–322)

## 2010-11-08 LAB — POCT HEMOGLOBIN-HEMACUE: Hemoglobin: 11.8 g/dL — ABNORMAL LOW (ref 13.0–17.0)

## 2010-11-27 LAB — POCT HEMOGLOBIN-HEMACUE: Hemoglobin: 12.2 g/dL — ABNORMAL LOW (ref 13.0–17.0)

## 2010-11-27 LAB — PHOSPHORUS: Phosphorus: 3.7 mg/dL (ref 2.3–4.6)

## 2010-11-27 LAB — COMPREHENSIVE METABOLIC PANEL
ALT: 13 U/L (ref 0–53)
AST: 20 U/L (ref 0–37)
Albumin: 3.9 g/dL (ref 3.5–5.2)
Alkaline Phosphatase: 71 U/L (ref 39–117)
BUN: 53 mg/dL — ABNORMAL HIGH (ref 6–23)
CO2: 28 mEq/L (ref 19–32)
Calcium: 9 mg/dL (ref 8.4–10.5)
Chloride: 105 mEq/L (ref 96–112)
Creatinine, Ser: 3.35 mg/dL — ABNORMAL HIGH (ref 0.4–1.5)
GFR calc Af Amer: 22 mL/min — ABNORMAL LOW (ref >60)
GFR calc non Af Amer: 18 mL/min — ABNORMAL LOW (ref >60)
Glucose, Bld: 135 mg/dL — ABNORMAL HIGH (ref 70–99)
Potassium: 4.2 mEq/L (ref 3.5–5.1)
Sodium: 144 mEq/L (ref 135–145)
Total Bilirubin: 0.5 mg/dL (ref 0.3–1.2)
Total Protein: 6.9 g/dL (ref 6.0–8.3)

## 2010-11-27 LAB — FERRITIN: Ferritin: 115 ng/mL (ref 22–322)

## 2010-11-27 LAB — IRON AND TIBC
Iron: 87 ug/dL (ref 42–135)
Saturation Ratios: 36 % (ref 20–55)
TIBC: 242 ug/dL (ref 215–435)
UIBC: 155 ug/dL

## 2010-11-27 LAB — URIC ACID: Uric Acid, Serum: 4.9 mg/dL (ref 4.0–7.8)

## 2010-12-05 NOTE — Assessment & Plan Note (Signed)
Summary: f/up,tcb   Vital Signs:  Patient profile:   77 year old male Height:      70 inches (177.80 cm) Weight:      183.19 pounds (83.27 kg) BMI:     26.38 Temp:     97.6 degrees F (36.44 degrees C) oral Pulse rate:   63 / minute BP sitting:   164 / 77  (left arm)  Vitals Entered By: Starleen Arms CMA (June 30, 2010 3:29 PM) CC: check up Is Patient Diabetic? No Pain Assessment Patient in pain? no      Nutritional Status BMI of 25 - 29 = overweight  Does patient need assistance? Functional Status Self care Ambulation Normal   CC:  check up.  History of Present Illness: Here to follow up on hyperlipidemia; also with skin lesion on R scapular area and asks that this be removed.   Continues to follow up at [facility] for his microangiopathic Glomerulonephritis.  Has ankle edema on most days; continues with lasix (see med list).  Has labs every 3 months; most recent labs from 06/21/2010: Na+141; K+4.0; BUN 35; Cr 3.14, glucose 151.  Denies chest pain, dyspnea.    Plans to go camping in his motor home with his wife over Labor Day, to [employer] for his wife, who suffers from dementia.  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  Tries to adhere to strict renal diet, says he is tired of this and wishes to eat as he wishes.   Current Medications (verified): 1)  Lasix 80 Mg Tabs (Furosemide) .... Take 1 Tablet By Mouth Twice A Day 2)  Pentoxifylline Cr 400 Mg Tbcr (Pentoxifylline) .... Take One Tablet Three Times A Day 3)  Fluocinonide 0.05 %  Crea (Fluocinonide) .... Apply Twice Daily Disp 1 Tube 4)  Gabapentin 600 Mg  Tabs (Gabapentin) .... Take One Tab Twice A Day 5)  Zocor 20 Mg  Tabs (Simvastatin) .... One Tab Daily 6)  Allopurinol 300 Mg Tabs (Allopurinol) .Marland Kitchen.. 1 By Mouth Qd 7)  Carvedilol 6.25 Mg Tabs (Carvedilol) .Marland Kitchen.. 1 By Mouth Bid 8)  Aspirin 81 Mg Tbec (Aspirin) .Marland Kitchen.. 1 By Mouth Qd 9)  Ferrous Sulfate 324 Mg Tabs  (Ferrous Sulfate) .Marland Kitchen.. 1 By Mouth Bid  Allergies (verified): 1)  ! Penicillin G Potassium (Penicillin G Potassium)  Physical Exam  General:  Well appearing, no apparent distress.  Sallow. Eyes:  clear sclerae Mouth:  MMM Neck:  Neck supple. No cervical adenopathy noted.  Lungs:  Normal respiratory effort, chest expands symmetrically. Lungs are clear to auscultation, no crackles or wheezes. Heart:  Normal rate and regular rhythm. S1 and S2 normal without gallop, murmur, click, rub or other extra sounds. Extremities:  2+ bilat pitting edema to 1/2 way up shins. No foot lesions on inspection.  Palpable dp pulses bilaterally Neurologic:  Sensation grossly intact in feet bilat.  No skin maceration.  Skin:  1.5x1.5cm raised flat hyperpigmented verrocous skin lesion at R scapular area.   Additional Exam:  Procedure Note; Discussed risk/benefits of shave excision of skin lesion over R scapula. Patient given chance to ask questions, gives clear verbal consent.  Lidocaine with epinephrine infiltrated to make wheal under entire lesion.  11-blade scalpel used after cleaning in sterile fashion; shave excision of entire lesion performed with good hemostasis and cosmetic result.  Silver nitrate applied, coated in antibiotic oint and bandaged.  Aftercare instructions given verbally.    Impression &  Recommendations:  Problem # 1:  CORONARY, ARTERIOSCLEROSIS (ICD-414.00)  Patient with CAD diagnosed by cardiac cath 1999. Denies chest pain. Continues to take zocor 20mg  daily, no lipid values in EMR.  Not fasting now, will check direct LDL today; consider fasting lipid profile in future.  His updated medication list for this problem includes:    Lasix 80 Mg Tabs (Furosemide) .Marland Kitchen... Take 1 tablet by mouth twice a day    Carvedilol 6.25 Mg Tabs (Carvedilol) .Marland Kitchen... 1 by mouth bid    Aspirin 81 Mg Tbec (Aspirin) .Marland Kitchen... 1 by mouth qd  Orders: [facility]- Est  Level 4 (99214)  Problem # 2:  NEOPLASM OF UNCERTAIN BEHAVIOR  OF SKIN (ICD-238.2) Assessment: New Biopsy of skin lesion, raised and hyperpigmented, along R scapular area measuring1.5x1.5cm.  Verrucous versus AK, less likely SCC.  Shave biopsy performed, specimen sent for pathology.    Problem # 3:  CHRONIC GLN W/UNSPEC PATHOLOGICAL LESION KIDNEY (ICD-582.9)  To continue q21month visits with Dr Eliott Nine. Dorathy Kinsman Procrit every other week at [facility].   Orders: [facility]- Est  Level 4 (16109)  Complete Medication List: 1)  Lasix 80 Mg Tabs (Furosemide) .... Take 1 tablet by mouth twice a day 2)  Pentoxifylline Cr 400 Mg Tbcr (Pentoxifylline) .... Take one tablet three times a day 3)  Fluocinonide 0.05 % Crea (Fluocinonide) .... Apply twice daily disp 1 tube 4)  Gabapentin 600 Mg Tabs (Gabapentin) .... Take one tab twice a day 5)  Zocor 20 Mg Tabs (Simvastatin) .... One tab daily 6)  Allopurinol 300 Mg Tabs (Allopurinol) .Marland Kitchen.. 1 by mouth qd 7)  Carvedilol 6.25 Mg Tabs (Carvedilol) .Marland Kitchen.. 1 by mouth bid 8)  Aspirin 81 Mg Tbec (Aspirin) .Marland Kitchen.. 1 by mouth qd 9)  Ferrous Sulfate 324 Mg Tabs (Ferrous sulfate) .Marland Kitchen.. 1 by mouth bid  Other Orders: Direct LDL-[facility] ([id number])

## 2010-12-05 NOTE — Progress Notes (Signed)
Summary: refill  Phone Note Refill Request Call back at Home Phone [phone] Message from:  Patient  Refills Requested: Medication #1:  FLUOCINONIDE 0.05 %  CREA apply twice daily has been requesting it  Initial call taken by: De Nurse,  December 08, 2009 3:15 PM  Follow-up for Phone Call        to pcp Follow-up by: Golden Circle RN,  December 08, 2009 3:27 PM    Dr. Darrick Penna refilled this already.  Please see the Meds list for the refill information. Paula Compton MD  December 08, 2009 4:23 PM

## 2010-12-05 NOTE — Assessment & Plan Note (Signed)
Summary: flu shot,df  Nurse Visit   Vitals Entered By: Arlyss Repress CMA, (August 08, 2010 11:39 AM)  Allergies: 1)  ! Penicillin G Potassium (Penicillin G Potassium)  Immunizations Administered:  Influenza Vaccine # 1:    Vaccine Type: Fluvax MCR    Site: left deltoid    Mfr: GlaxoSmithKline    Dose: 0.5 ml    Route: IM    Given by: Arlyss Repress CMA,    Exp. Date: 05/02/2011    Lot #: [id number]    VIS given: 05/30/10 version given August 08, 2010.  Flu Vaccine Consent Questions:    Do you have a history of severe allergic reactions to this vaccine? no    Any prior history of allergic reactions to egg and/or gelatin? no    Do you have a sensitivity to the preservative Thimersol? no    Do you have a past history of Guillan-Barre Syndrome? no    Do you currently have an acute febrile illness? no    Have you ever had a severe reaction to latex? no    Vaccine information given and explained to patient? yes  Orders Added: 1)  Influenza Vaccine MCR [00025] 2)  Administration Flu vaccine - MCR [G0008]

## 2010-12-05 NOTE — Consult Note (Signed)
Summary: [facility]  [facility]   Imported By: Clydell Hakim 02/23/2010 11:10:12  _____________________________________________________________________  External Attachment:    Type:   Image     Comment:   External Document

## 2010-12-05 NOTE — Consult Note (Signed)
Summary: [facility]  [facility]   Imported By: De Nurse 07/07/2010 12:12:52  _____________________________________________________________________  External Attachment:    Type:   Image     Comment:   External Document

## 2010-12-05 NOTE — Letter (Signed)
Summary: Generic Letter  [facility]  [address]   [place], [place] [address]   Phone: [phone]  Fax: [phone]    07/05/2010  Scott Holmes [address] [place], [place]  [address]  Dear [name],   I write with good news about the skin biopsy we did at your last visit, and about your LDL ("bad") cholesterol level.  The skin biopsy showed a kind of skin lesion that is not skin cancer.   The LDL cholesterol is low at 85.  This is very good news.    Please be in touch with any other concerns.    Until your next visit,     Paula Compton MD  Appended Document: Generic Letter mailed

## 2010-12-05 NOTE — Consult Note (Signed)
Summary: [facility]  [facility]   Imported By: De Nurse 12/09/2009 17:10:44  _____________________________________________________________________  External Attachment:    Type:   Image     Comment:   External Document

## 2010-12-06 ENCOUNTER — Other Ambulatory Visit: Payer: Self-pay

## 2010-12-06 ENCOUNTER — Encounter (HOSPITAL_COMMUNITY): Payer: Medicare Other | Attending: Nephrology

## 2010-12-06 DIAGNOSIS — N184 Chronic kidney disease, stage 4 (severe): Secondary | ICD-10-CM | POA: Insufficient documentation

## 2010-12-06 DIAGNOSIS — D638 Anemia in other chronic diseases classified elsewhere: Secondary | ICD-10-CM | POA: Insufficient documentation

## 2010-12-07 NOTE — Consult Note (Signed)
Summary: [facility]  [facility]   Imported By: De Nurse 10/25/2010 10:59:44  _____________________________________________________________________  External Attachment:    Type:   Image     Comment:   External Document

## 2010-12-20 ENCOUNTER — Other Ambulatory Visit: Payer: Self-pay | Admitting: Nephrology

## 2010-12-20 ENCOUNTER — Other Ambulatory Visit: Payer: Self-pay

## 2010-12-20 ENCOUNTER — Encounter (HOSPITAL_COMMUNITY): Payer: Medicare Other

## 2010-12-20 LAB — POCT HEMOGLOBIN-HEMACUE: Hemoglobin: 11.3 g/dL — ABNORMAL LOW (ref 13.0–17.0)

## 2010-12-20 LAB — IRON AND TIBC: UIBC: 191 ug/dL

## 2010-12-25 LAB — POCT HEMOGLOBIN-HEMACUE: Hemoglobin: 11.1 g/dL — ABNORMAL LOW (ref 13.0–17.0)

## 2011-01-03 ENCOUNTER — Other Ambulatory Visit: Payer: Self-pay

## 2011-01-03 ENCOUNTER — Encounter (HOSPITAL_COMMUNITY): Payer: Medicare Other

## 2011-01-04 LAB — POCT HEMOGLOBIN-HEMACUE: Hemoglobin: 11.8 g/dL — ABNORMAL LOW (ref 13.0–17.0)

## 2011-01-08 ENCOUNTER — Encounter: Payer: Self-pay | Admitting: Home Health Services

## 2011-01-09 ENCOUNTER — Encounter: Payer: Self-pay | Admitting: Family Medicine

## 2011-01-09 ENCOUNTER — Other Ambulatory Visit: Payer: Self-pay | Admitting: Family Medicine

## 2011-01-09 ENCOUNTER — Ambulatory Visit (INDEPENDENT_AMBULATORY_CARE_PROVIDER_SITE_OTHER): Payer: Medicare Other | Admitting: Family Medicine

## 2011-01-09 VITALS — BP 130/70 | HR 72 | Temp 97.5°F | Ht 70.0 in | Wt 178.0 lb

## 2011-01-09 DIAGNOSIS — M109 Gout, unspecified: Secondary | ICD-10-CM

## 2011-01-09 DIAGNOSIS — N19 Unspecified kidney failure: Secondary | ICD-10-CM

## 2011-01-09 DIAGNOSIS — L570 Actinic keratosis: Secondary | ICD-10-CM

## 2011-01-09 DIAGNOSIS — L209 Atopic dermatitis, unspecified: Secondary | ICD-10-CM | POA: Insufficient documentation

## 2011-01-09 DIAGNOSIS — L2089 Other atopic dermatitis: Secondary | ICD-10-CM

## 2011-01-09 MED ORDER — CLOBETASOL PROPIONATE 0.05 % EX GEL: 0.5000 g | Freq: Two times a day (BID) | CUTANEOUS | Status: DC

## 2011-01-09 NOTE — Assessment & Plan Note (Signed)
Erythema and excoriation along bilat ankles, lower legs. Over distribution of socks.  Scott Holmes mentions he has changed types of socks recently.  THis problem is longstanding, over 1 yr.  To try mod potency topical clobetasol for up to 2 weeks.  To contact me if not improving.

## 2011-01-09 NOTE — Assessment & Plan Note (Signed)
Small AK on R forehead, applied liquid Nitrogen today. Larger 1cm2 hyperpigmented lesion L postauricular/mastoid area.  Shave biopsy performed, with good cosmetic results and no bleeding.

## 2011-01-09 NOTE — Assessment & Plan Note (Signed)
Gout for which he sees Dr Kellie Simmering.  Last uric acid level was 4.9 on Nov 22, 2010

## 2011-01-09 NOTE — Assessment & Plan Note (Signed)
Followed by Dr. Eliott Nine (nephrologist).  Has labs done every 4 months, last metabolic panel in Nov 22, 2010 with BUN and Cr of 53 and 3.35, albumin 3.9, total protein 6.9.  For next labs in April with Dr Eliott Nine.

## 2011-01-09 NOTE — Patient Instructions (Signed)
It was a pleasure to see you today.  I removed a 1cm mole from behind your left ear today.  I froze the mole on your R forehead.   I sent a prescription to the pharmacy for a new cream for your legs; please use it for up to 14 days in a row.  Let me know if it is not helping.

## 2011-01-09 NOTE — Progress Notes (Signed)
  Subjective:    Patient ID: Scott Holmes, male    DOB: [birth year], 77 y.o.   MRN: [id number]  HPI Reports feeling well. Primary reason for today's visit is to remove skin lesion behind L ear; to freeze AK on R forehead.    Reports over  1 year of itching red rash over lower legs and ankles.  Has tried BenGay, topical anti-itch and jock itch preparations to no avail.  "I'm about to climb the wall."  The swelling of his legs is unchanged per his report.    Uses Lidex cream on old shingles lesion when he gets postherpetic neuralgia.  Not using regularly.    Review of Systems     Objective:   Physical Exam  Constitutional: He appears well-developed and well-nourished.  Neck: Normal range of motion. Neck supple.  Cardiovascular: Normal rate and regular rhythm.   Pulmonary/Chest: Effort normal and breath sounds normal.  Musculoskeletal:       2+ symmetric pitting edema over lower 1/2 leg bilat.  Palpable dp pulses bilat.  No maceration or skin lesion interdigitary on either foot.  Lichenification at bases of toes on both feet, dorsum.   Erythema with excoriation around ankles and medial malleoli bilaterally. None on soles or higher up than 1/2 way on lower leg. Symmetric pattern.  Skin:       1cm2 hyperpigmented lesion with fibriae behind L ear; raised.  22mm2 hyperpigmented lesion on R forehead which appears consistent with an AK.            Assessment & Plan:  Procedure note:  Discussed risk and benefits of shave biopsy to remove the postauricular (L) lesion.  Patient not on anticoagulants other than aspirin, no severe bleeding reactions.  Patient given opportunity to ask questions, verbal and written consent received.  Area prepped and infiltrated with 1%lido with epinephrine using TB syringe.  Sterile technique used to shave the 1cm diam. Lesion, bleeding minimal and controlled with silver nitrate sticks.  Dressed with triple abx ointment and bandage.  TOlerated well.   R forehead  skin lesion frozen for 30 seconds with liquid nitrogen, tolerated well.

## 2011-01-11 ENCOUNTER — Encounter: Payer: Self-pay | Admitting: Family Medicine

## 2011-01-12 ENCOUNTER — Other Ambulatory Visit: Payer: Self-pay | Admitting: Family Medicine

## 2011-01-12 NOTE — Telephone Encounter (Signed)
Refill request

## 2011-01-15 LAB — POCT HEMOGLOBIN-HEMACUE: Hemoglobin: 11.7 g/dL — ABNORMAL LOW (ref 13.0–17.0)

## 2011-01-16 LAB — IRON AND TIBC
Iron: 89 ug/dL (ref 42–135)
Saturation Ratios: 30 % (ref 20–55)
UIBC: 207 ug/dL

## 2011-01-17 ENCOUNTER — Encounter (HOSPITAL_COMMUNITY): Payer: Medicare Other | Attending: Nephrology

## 2011-01-17 ENCOUNTER — Other Ambulatory Visit: Payer: Self-pay | Admitting: Nephrology

## 2011-01-17 ENCOUNTER — Other Ambulatory Visit: Payer: Self-pay

## 2011-01-17 DIAGNOSIS — N184 Chronic kidney disease, stage 4 (severe): Secondary | ICD-10-CM | POA: Insufficient documentation

## 2011-01-17 DIAGNOSIS — D638 Anemia in other chronic diseases classified elsewhere: Secondary | ICD-10-CM | POA: Insufficient documentation

## 2011-01-17 LAB — IRON AND TIBC
Iron: 63 ug/dL (ref 42–135)
Saturation Ratios: 25 % (ref 20–55)
UIBC: 192 ug/dL
UIBC: 193 ug/dL

## 2011-01-17 LAB — POCT HEMOGLOBIN-HEMACUE
Hemoglobin: 11 g/dL — ABNORMAL LOW (ref 13.0–17.0)
Hemoglobin: 11.7 g/dL — ABNORMAL LOW (ref 13.0–17.0)

## 2011-01-18 LAB — IRON AND TIBC
TIBC: 267 ug/dL (ref 215–435)
TIBC: 284 ug/dL (ref 215–435)
UIBC: 214 ug/dL
UIBC: 198 ug/dL

## 2011-01-18 LAB — PLATELET COUNT: Platelets: 197 10*3/uL (ref 150–400)

## 2011-01-18 LAB — COMPREHENSIVE METABOLIC PANEL
ALT: 14 U/L (ref 0–53)
Albumin: 3.6 g/dL (ref 3.5–5.2)
Alkaline Phosphatase: 82 U/L (ref 39–117)
BUN: 47 mg/dL — ABNORMAL HIGH (ref 6–23)
Calcium: 8.8 mg/dL (ref 8.4–10.5)
Potassium: 4 mEq/L (ref 3.5–5.1)
Sodium: 142 mEq/L (ref 135–145)
Total Protein: 6.8 g/dL (ref 6.0–8.3)

## 2011-01-18 LAB — FERRITIN: Ferritin: 142 ng/mL (ref 22–322)

## 2011-01-18 LAB — POCT HEMOGLOBIN-HEMACUE: Hemoglobin: 11.4 g/dL — ABNORMAL LOW (ref 13.0–17.0)

## 2011-01-19 LAB — POCT HEMOGLOBIN-HEMACUE
Hemoglobin: 10.8 g/dL — ABNORMAL LOW (ref 13.0–17.0)
Hemoglobin: 11.8 g/dL — ABNORMAL LOW (ref 13.0–17.0)

## 2011-01-19 LAB — IRON AND TIBC: TIBC: 262 ug/dL (ref 215–435)

## 2011-01-20 LAB — POCT HEMOGLOBIN-HEMACUE: Hemoglobin: 11.6 g/dL — ABNORMAL LOW (ref 13.0–17.0)

## 2011-01-21 LAB — COMPREHENSIVE METABOLIC PANEL
ALT: 14 U/L (ref 0–53)
AST: 18 U/L (ref 0–37)
Albumin: 3.6 g/dL (ref 3.5–5.2)
Alkaline Phosphatase: 90 U/L (ref 39–117)
CO2: 25 mEq/L (ref 19–32)
Chloride: 106 mEq/L (ref 96–112)
Creatinine, Ser: 3.14 mg/dL — ABNORMAL HIGH (ref 0.4–1.5)
GFR calc Af Amer: 24 mL/min — ABNORMAL LOW (ref >60)
GFR calc non Af Amer: 19 mL/min — ABNORMAL LOW (ref >60)
Potassium: 4 mEq/L (ref 3.5–5.1)
Sodium: 141 mEq/L (ref 135–145)
Total Bilirubin: 0.3 mg/dL (ref 0.3–1.2)

## 2011-01-21 LAB — POCT HEMOGLOBIN-HEMACUE: Hemoglobin: 11.3 g/dL — ABNORMAL LOW (ref 13.0–17.0)

## 2011-01-21 LAB — URIC ACID: Uric Acid, Serum: 5 mg/dL (ref 4.0–7.8)

## 2011-01-22 LAB — IRON AND TIBC
TIBC: 258 ug/dL (ref 215–435)
TIBC: 260 ug/dL (ref 215–435)
UIBC: 182 ug/dL

## 2011-01-22 LAB — POCT HEMOGLOBIN-HEMACUE
Hemoglobin: 11.6 g/dL — ABNORMAL LOW (ref 13.0–17.0)
Hemoglobin: 11.6 g/dL — ABNORMAL LOW (ref 13.0–17.0)

## 2011-01-22 LAB — FERRITIN: Ferritin: 238 ng/mL (ref 22–322)

## 2011-01-23 LAB — IRON AND TIBC
Iron: 48 ug/dL (ref 42–135)
Saturation Ratios: 18 % — ABNORMAL LOW (ref 20–55)
TIBC: 264 ug/dL (ref 215–435)
UIBC: 216 ug/dL

## 2011-01-23 LAB — POCT HEMOGLOBIN-HEMACUE: Hemoglobin: 11.6 g/dL — ABNORMAL LOW (ref 13.0–17.0)

## 2011-01-24 LAB — IRON AND TIBC
Iron: 68 ug/dL (ref 42–135)
UIBC: 182 ug/dL

## 2011-01-26 LAB — IRON AND TIBC
Iron: 60 ug/dL (ref 42–135)
Saturation Ratios: 23 % (ref 20–55)
TIBC: 258 ug/dL (ref 215–435)

## 2011-01-26 LAB — POCT HEMOGLOBIN-HEMACUE
Hemoglobin: 11.7 g/dL — ABNORMAL LOW (ref 13.0–17.0)
Hemoglobin: 12.2 g/dL — ABNORMAL LOW (ref 13.0–17.0)

## 2011-01-29 LAB — COMPREHENSIVE METABOLIC PANEL
CO2: 26 mEq/L (ref 19–32)
Calcium: 8.5 mg/dL (ref 8.4–10.5)
Chloride: 108 mEq/L (ref 96–112)
Creatinine, Ser: 2.78 mg/dL — ABNORMAL HIGH (ref 0.4–1.5)
GFR calc non Af Amer: 22 mL/min — ABNORMAL LOW (ref >60)
Glucose, Bld: 149 mg/dL — ABNORMAL HIGH (ref 70–99)
Total Bilirubin: 0.6 mg/dL (ref 0.3–1.2)

## 2011-01-29 LAB — IRON AND TIBC
Iron: 81 ug/dL (ref 42–135)
Saturation Ratios: 31 % (ref 20–55)
UIBC: 182 ug/dL

## 2011-01-29 LAB — POCT HEMOGLOBIN-HEMACUE
Hemoglobin: 11.2 g/dL — ABNORMAL LOW (ref 13.0–17.0)
Hemoglobin: 12 g/dL — ABNORMAL LOW (ref 13.0–17.0)

## 2011-01-31 ENCOUNTER — Other Ambulatory Visit: Payer: Self-pay | Admitting: Nephrology

## 2011-01-31 ENCOUNTER — Encounter (HOSPITAL_COMMUNITY): Payer: Medicare Other

## 2011-02-01 ENCOUNTER — Other Ambulatory Visit: Payer: Self-pay | Admitting: Family Medicine

## 2011-02-01 LAB — PTH, INTACT AND CALCIUM
Calcium, Total (PTH): 8.7 mg/dL (ref 8.4–10.5)
PTH: 101.5 pg/mL — ABNORMAL HIGH (ref 14.0–72.0)

## 2011-02-01 NOTE — Telephone Encounter (Signed)
Refill request

## 2011-02-05 LAB — COMPREHENSIVE METABOLIC PANEL
AST: 19 U/L (ref 0–37)
Albumin: 3.7 g/dL (ref 3.5–5.2)
BUN: 29 mg/dL — ABNORMAL HIGH (ref 6–23)
Chloride: 107 mEq/L (ref 96–112)
Creatinine, Ser: 2.64 mg/dL — ABNORMAL HIGH (ref 0.4–1.5)
GFR calc Af Amer: 29 mL/min — ABNORMAL LOW (ref >60)
Potassium: 4.4 mEq/L (ref 3.5–5.1)
Total Bilirubin: 0.4 mg/dL (ref 0.3–1.2)
Total Protein: 6.5 g/dL (ref 6.0–8.3)

## 2011-02-05 LAB — URIC ACID: Uric Acid, Serum: 4.6 mg/dL (ref 4.0–7.8)

## 2011-02-05 LAB — PTH, INTACT AND CALCIUM: Calcium, Total (PTH): 8.9 mg/dL (ref 8.4–10.5)

## 2011-02-05 LAB — POCT HEMOGLOBIN-HEMACUE: Hemoglobin: 11.1 g/dL — ABNORMAL LOW (ref 13.0–17.0)

## 2011-02-06 LAB — POCT HEMOGLOBIN-HEMACUE: Hemoglobin: 12.2 g/dL — ABNORMAL LOW (ref 13.0–17.0)

## 2011-02-07 LAB — IRON AND TIBC: Iron: 70 ug/dL (ref 42–135)

## 2011-02-07 LAB — POCT HEMOGLOBIN-HEMACUE: Hemoglobin: 11.7 g/dL — ABNORMAL LOW (ref 13.0–17.0)

## 2011-02-08 LAB — POCT HEMOGLOBIN-HEMACUE
Hemoglobin: 11.1 g/dL — ABNORMAL LOW (ref 13.0–17.0)
Hemoglobin: 11.8 g/dL — ABNORMAL LOW (ref 13.0–17.0)

## 2011-02-08 LAB — IRON AND TIBC
Iron: 62 ug/dL (ref 42–135)
Saturation Ratios: 24 % (ref 20–55)
TIBC: 254 ug/dL (ref 215–435)
UIBC: 192 ug/dL

## 2011-02-09 LAB — PHOSPHORUS: Phosphorus: 4.1 mg/dL (ref 2.3–4.6)

## 2011-02-09 LAB — POCT HEMOGLOBIN-HEMACUE: Hemoglobin: 11.8 g/dL — ABNORMAL LOW (ref 13.0–17.0)

## 2011-02-09 LAB — COMPREHENSIVE METABOLIC PANEL
ALT: 16 U/L (ref 0–53)
Alkaline Phosphatase: 66 U/L (ref 39–117)
CO2: 29 mEq/L (ref 19–32)
Chloride: 104 mEq/L (ref 96–112)
Glucose, Bld: 150 mg/dL — ABNORMAL HIGH (ref 70–99)
Potassium: 3.8 mEq/L (ref 3.5–5.1)
Sodium: 141 mEq/L (ref 135–145)
Total Protein: 6.8 g/dL (ref 6.0–8.3)

## 2011-02-09 LAB — IRON AND TIBC
Saturation Ratios: 30 % (ref 20–55)
UIBC: 179 ug/dL

## 2011-02-10 LAB — PTH, INTACT AND CALCIUM
Calcium, Total (PTH): 9.1 mg/dL (ref 8.4–10.5)
PTH: 34.1 pg/mL (ref 14.0–72.0)

## 2011-02-10 LAB — IRON AND TIBC: Iron: 65 ug/dL (ref 42–135)

## 2011-02-11 LAB — IRON AND TIBC
Iron: 54 ug/dL (ref 42–135)
UIBC: 195 ug/dL

## 2011-02-11 LAB — POCT HEMOGLOBIN-HEMACUE: Hemoglobin: 11.5 g/dL — ABNORMAL LOW (ref 13.0–17.0)

## 2011-02-12 LAB — POCT HEMOGLOBIN-HEMACUE
Hemoglobin: 10 g/dL — ABNORMAL LOW (ref 13.0–17.0)
Hemoglobin: 11 g/dL — ABNORMAL LOW (ref 13.0–17.0)

## 2011-02-12 LAB — IRON AND TIBC
Iron: 54 ug/dL (ref 42–135)
Saturation Ratios: 23 % (ref 20–55)
UIBC: 182 ug/dL

## 2011-02-13 LAB — IRON AND TIBC
Iron: 47 ug/dL (ref 42–135)
Saturation Ratios: 20 % (ref 20–55)
TIBC: 240 ug/dL (ref 215–435)
UIBC: 193 ug/dL

## 2011-02-13 LAB — PHOSPHORUS: Phosphorus: 2.7 mg/dL (ref 2.3–4.6)

## 2011-02-13 LAB — COMPREHENSIVE METABOLIC PANEL
BUN: 34 mg/dL — ABNORMAL HIGH (ref 6–23)
CO2: 30 mEq/L (ref 19–32)
Calcium: 9 mg/dL (ref 8.4–10.5)
Creatinine, Ser: 2.64 mg/dL — ABNORMAL HIGH (ref 0.4–1.5)
GFR calc non Af Amer: 24 mL/min — ABNORMAL LOW (ref >60)
Glucose, Bld: 178 mg/dL — ABNORMAL HIGH (ref 70–99)
Total Protein: 6.6 g/dL (ref 6.0–8.3)

## 2011-02-13 LAB — POCT HEMOGLOBIN-HEMACUE: Hemoglobin: 11.6 g/dL — ABNORMAL LOW (ref 13.0–17.0)

## 2011-02-14 ENCOUNTER — Other Ambulatory Visit: Payer: Self-pay | Admitting: Nephrology

## 2011-02-14 ENCOUNTER — Encounter (HOSPITAL_COMMUNITY): Payer: Medicare Other | Attending: Nephrology

## 2011-02-14 DIAGNOSIS — D638 Anemia in other chronic diseases classified elsewhere: Secondary | ICD-10-CM | POA: Insufficient documentation

## 2011-02-14 DIAGNOSIS — N184 Chronic kidney disease, stage 4 (severe): Secondary | ICD-10-CM | POA: Insufficient documentation

## 2011-02-14 LAB — POCT HEMOGLOBIN-HEMACUE
Hemoglobin: 12.3 g/dL — ABNORMAL LOW (ref 13.0–17.0)
Hemoglobin: 11.4 g/dL — ABNORMAL LOW (ref 13.0–17.0)

## 2011-02-14 LAB — IRON AND TIBC
Iron: 98 ug/dL (ref 42–135)
Saturation Ratios: 24 % (ref 20–55)
TIBC: 283 ug/dL (ref 215–435)
UIBC: 185 ug/dL

## 2011-02-14 LAB — PTH, INTACT AND CALCIUM
Calcium, Total (PTH): 9.4 mg/dL (ref 8.4–10.5)
PTH: 68.5 pg/mL (ref 14.0–72.0)

## 2011-02-14 LAB — LIPID PANEL
HDL: 44 mg/dL (ref >39)
Total CHOL/HDL Ratio: 3.7 RATIO

## 2011-02-14 LAB — CBC
HCT: 35.3 % — ABNORMAL LOW (ref 39.0–52.0)
Hemoglobin: 12.1 g/dL — ABNORMAL LOW (ref 13.0–17.0)
MCV: 95.5 fL (ref 78.0–100.0)
Platelets: 197 10*3/uL (ref 150–400)
RDW: 17.9 % — ABNORMAL HIGH (ref 11.5–15.5)

## 2011-02-15 LAB — FERRITIN: Ferritin: 529 ng/mL — ABNORMAL HIGH (ref 22–322)

## 2011-02-15 LAB — POCT HEMOGLOBIN-HEMACUE: Hemoglobin: 11.4 g/dL — ABNORMAL LOW (ref 13.0–17.0)

## 2011-02-15 LAB — IRON AND TIBC: UIBC: 164 ug/dL

## 2011-02-19 LAB — FERRITIN: Ferritin: 639 ng/mL — ABNORMAL HIGH (ref 22–322)

## 2011-02-19 LAB — RENAL FUNCTION PANEL
BUN: 22 mg/dL (ref 6–23)
CO2: 26 mEq/L (ref 19–32)
Calcium: 9.1 mg/dL (ref 8.4–10.5)
Chloride: 102 mEq/L (ref 96–112)
Creatinine, Ser: 2.92 mg/dL — ABNORMAL HIGH (ref 0.4–1.5)

## 2011-02-19 LAB — PTH, INTACT AND CALCIUM
Calcium, Total (PTH): 9.2 mg/dL (ref 8.4–10.5)
Calcium, Total (PTH): 9 mg/dL (ref 8.4–10.5)

## 2011-02-19 LAB — IRON AND TIBC
Iron: 110 ug/dL (ref 42–135)
Saturation Ratios: 41 % (ref 20–55)
UIBC: 161 ug/dL

## 2011-02-19 LAB — POCT HEMOGLOBIN-HEMACUE: Hemoglobin: 11.8 g/dL — ABNORMAL LOW (ref 13.0–17.0)

## 2011-02-20 LAB — COMPREHENSIVE METABOLIC PANEL
ALT: 13 U/L (ref 0–53)
AST: 17 U/L (ref 0–37)
Albumin: 3.6 g/dL (ref 3.5–5.2)
Alkaline Phosphatase: 78 U/L (ref 39–117)
Potassium: 3.9 mEq/L (ref 3.5–5.1)
Sodium: 139 mEq/L (ref 135–145)
Total Protein: 7.2 g/dL (ref 6.0–8.3)

## 2011-02-20 LAB — FERRITIN: Ferritin: 647 ng/mL — ABNORMAL HIGH (ref 22–322)

## 2011-02-20 LAB — POCT HEMOGLOBIN-HEMACUE
Hemoglobin: 11.7 g/dL — ABNORMAL LOW (ref 13.0–17.0)
Hemoglobin: 11.1 g/dL — ABNORMAL LOW (ref 13.0–17.0)

## 2011-02-20 LAB — URIC ACID: Uric Acid, Serum: 6.1 mg/dL (ref 4.0–7.8)

## 2011-02-20 LAB — PHOSPHORUS: Phosphorus: 5.1 mg/dL — ABNORMAL HIGH (ref 2.3–4.6)

## 2011-02-28 ENCOUNTER — Other Ambulatory Visit: Payer: Self-pay | Admitting: Nephrology

## 2011-02-28 ENCOUNTER — Encounter (HOSPITAL_COMMUNITY): Payer: Medicare Other

## 2011-02-28 LAB — COMPREHENSIVE METABOLIC PANEL
Albumin: 3.8 g/dL (ref 3.5–5.2)
BUN: 45 mg/dL — ABNORMAL HIGH (ref 6–23)
Chloride: 109 mEq/L (ref 96–112)
Creatinine, Ser: 2.84 mg/dL — ABNORMAL HIGH (ref 0.4–1.5)
Total Bilirubin: 0.5 mg/dL (ref 0.3–1.2)

## 2011-02-28 LAB — POCT HEMOGLOBIN-HEMACUE: Hemoglobin: 11.8 g/dL — ABNORMAL LOW (ref 13.0–17.0)

## 2011-02-28 LAB — URIC ACID: Uric Acid, Serum: 5.3 mg/dL (ref 4.0–7.8)

## 2011-03-14 ENCOUNTER — Other Ambulatory Visit: Payer: Self-pay | Admitting: Nephrology

## 2011-03-14 ENCOUNTER — Encounter (HOSPITAL_COMMUNITY): Payer: Medicare Other | Attending: Nephrology

## 2011-03-15 ENCOUNTER — Encounter: Payer: Self-pay | Admitting: Family Medicine

## 2011-03-28 ENCOUNTER — Encounter (HOSPITAL_COMMUNITY): Payer: Medicare Other

## 2011-03-28 ENCOUNTER — Other Ambulatory Visit: Payer: Self-pay | Admitting: Nephrology

## 2011-03-28 LAB — IRON AND TIBC: TIBC: 268 ug/dL (ref 215–435)

## 2011-03-29 LAB — FERRITIN: Ferritin: 92 ng/mL (ref 22–322)

## 2011-04-11 ENCOUNTER — Encounter (HOSPITAL_COMMUNITY): Payer: Medicare Other | Attending: Nephrology

## 2011-04-11 ENCOUNTER — Other Ambulatory Visit: Payer: Self-pay | Admitting: Nephrology

## 2011-04-11 DIAGNOSIS — D638 Anemia in other chronic diseases classified elsewhere: Secondary | ICD-10-CM | POA: Insufficient documentation

## 2011-04-11 DIAGNOSIS — N184 Chronic kidney disease, stage 4 (severe): Secondary | ICD-10-CM | POA: Insufficient documentation

## 2011-04-11 LAB — POCT HEMOGLOBIN-HEMACUE: Hemoglobin: 11.6 g/dL — ABNORMAL LOW (ref 13.0–17.0)

## 2011-04-19 ENCOUNTER — Telehealth: Payer: Self-pay | Admitting: Family Medicine

## 2011-04-19 ENCOUNTER — Ambulatory Visit (INDEPENDENT_AMBULATORY_CARE_PROVIDER_SITE_OTHER): Payer: Medicare Other | Admitting: Family Medicine

## 2011-04-19 VITALS — BP 180/80 | HR 72 | Temp 97.6°F | Wt 167.2 lb

## 2011-04-19 DIAGNOSIS — R112 Nausea with vomiting, unspecified: Secondary | ICD-10-CM

## 2011-04-19 DIAGNOSIS — H81399 Other peripheral vertigo, unspecified ear: Secondary | ICD-10-CM | POA: Insufficient documentation

## 2011-04-19 DIAGNOSIS — M19019 Primary osteoarthritis, unspecified shoulder: Secondary | ICD-10-CM

## 2011-04-19 LAB — CBC
HCT: 39.1 % (ref 39.0–52.0)
Hemoglobin: 12.8 g/dL — ABNORMAL LOW (ref 13.0–17.0)
MCH: 31.3 pg (ref 26.0–34.0)
MCV: 95.6 fL (ref 78.0–100.0)
RBC: 4.09 MIL/uL — ABNORMAL LOW (ref 4.22–5.81)

## 2011-04-19 LAB — BASIC METABOLIC PANEL
BUN: 46 mg/dL — ABNORMAL HIGH (ref 6–23)
Chloride: 109 mEq/L (ref 96–112)
Potassium: 4.8 mEq/L (ref 3.5–5.3)

## 2011-04-19 MED ORDER — MECLIZINE HCL 25 MG PO TABS: ORAL_TABLET | ORAL | Status: DC

## 2011-04-19 MED ORDER — PROMETHAZINE HCL 25 MG/ML IJ SOLN
25.0000 mg | Freq: Once | INTRAMUSCULAR | Status: AC
  Administered 2011-04-19: 25 mg via INTRAMUSCULAR

## 2011-04-19 NOTE — Telephone Encounter (Signed)
Spoke with patient today at 2:10 . States he tried drinking some water and has  vomited X 2 since we talked earlier.  He has just gotten some Sprite and is now sipping on that and it seems to be staying down. Consulted with Dr. Sheffield Slider and he advises patient needs to be seen today. Son will be there there at 3:15 or so and he can be here by 4:00 PM. Appointment scheduled.

## 2011-04-19 NOTE — Patient Instructions (Addendum)
Return to see Dr Mauricio Po next Friday AM Call if not much improved from the vertigo by the beginning of the week.  Call sooner if the vomiting and diarrhea become worse.  Benign Positional Vertigo (BPV) Vertigo is a feeling that you are unsteady or that you or your surroundings are moving. Benign positional vertigo (BPV) is the most common form of vertigo. Benign means it does not have a serious cause. It is an upset in the balance system in your middle ear. This is troublesome but usually not serious. A viral infection or head injury are common causes, but often no cause is found. It is more common as we grow older. SYMPTOMS Sudden dizziness happens when you move your head in different directions. Some of the problems that come with this are:  Loss of balance  Throwing up   Blurred vision   Dizziness   Feeling sick to your stomach   DIAGNOSIS Your caregiver may do some specialized testing to prove what is wrong. HOME CARE INSTRUCTIONS  Rest and eat a well balanced diet.   Move slowly and do not make sudden body or head movements.   Do not drive a car or do any activities that could hurt you or others.   Lie down and rest. Take precautions to prevent falls.  SEEK IMMEDIATE MEDICAL CARE IF:  You develop headaches which are severe or lasting.   You develop continued vomiting.   A temporary loss or change of vision appears.   You notice temporary numbness on one side of your body.   You are temporarily unable to speak.   Temporary areas of weakness develop.   You have weakness or numbness in the face, arms, or legs.   You notice dizziness or difficulty walking.   You experience slurred speech or difficulty swallowing.  MAKE SURE YOU:   Understand these instructions.   Will watch your condition.   Will get help right away if you are not doing well or get worse.  Document Released: 07/30/2006 Document Re-Released: 02/07/2009 [facility] Patient Information 2011 [place],  [place].

## 2011-04-19 NOTE — Telephone Encounter (Signed)
Nausea/vomiting/dizzyness - has no one that can drive him and he says he's too sick to ride.  Wants to talk to nurse to see if there is something the doctor call in.

## 2011-04-19 NOTE — Progress Notes (Signed)
  Subjective:    Patient ID: Scott Holmes, male    DOB: May 18, 1932, 77 y.o.   MRN: [id number]  Emesis  Associated symptoms include diarrhea. Pertinent negatives include no chest pain, chills, fever or headaches.  Sudden onset of vertigo last evening, went to bath room and vomited. Sweats. Diarrhea x 1 last night, none since. Taking small amounts of Sprint now, but before vomited water.  Still has vertigo particularly when head is in certain positions he feels like he is spinning. Intermittent nausea. No hearing changes. No ill contacts    Review of Systems  Constitutional: Negative for fever and chills.  Eyes: Negative for visual disturbance.  Cardiovascular: Negative for chest pain.  Gastrointestinal: Positive for nausea, vomiting and diarrhea.  Genitourinary: Negative for dysuria and difficulty urinating.  Neurological: Negative for headaches.  Psychiatric/Behavioral: Negative for behavioral problems and confusion.       Objective:   Physical Exam  Constitutional: He is oriented to person, place, and time.       Mildly acutely ill appearing Chronically ill appearing  HENT:  Right Ear: External ear normal.  Left Ear: External ear normal.  Eyes: Conjunctivae are normal. Pupils are equal, round, and reactive to light.  Neck: Neck supple.  Cardiovascular: Normal rate and regular rhythm.   Pulmonary/Chest: Effort normal and breath sounds normal.  Abdominal: Soft. Bowel sounds are normal. He exhibits no distension and no mass. There is no tenderness. There is no rebound and no guarding.  Neurological: He is alert and oriented to person, place, and time. No cranial nerve deficit.       Romberg unsteady.  Took a few wide-based steps unsteadily Horizontal nystagmus during Hallpike maneuver with head to left, segued into Epley particle repositioning maneuver.          Assessment & Plan:

## 2011-04-19 NOTE — Telephone Encounter (Addendum)
Spoke with patient  At 12:15 and he reports vomiting which started at 11:45 last night. also had dizziness.  Has vomited frequently  about 12 times he states.  Last time vomiting was 6:00 AM today. Has not tried to drink anything yet. Has not taken any meds .Consulted with Dr. Leveda Anna and he advises to try to start drinking  clear liquids and if unable to hold anything down by 2:00 PM needs to go to ED or Urgent care to be seen. Advised to not take allopurinol, ASA, Iron or Lassix today . May take other meds when he  he is able to hold liquids down well. Will call patient back at 2:00 to check .

## 2011-04-20 ENCOUNTER — Telehealth: Payer: Self-pay | Admitting: Family Medicine

## 2011-04-20 NOTE — Assessment & Plan Note (Signed)
BPV vs labyrinthitis. Nystagmus extinguished and no headache to suggest central cause

## 2011-04-20 NOTE — Telephone Encounter (Signed)
I called and informed him that his lab tests are normal. He still feels dizzy and hasn't been very active, but no further vomiting or diarrhea and is taking the Meclizine regularly. No headache, tinnitus or change in hearing. I encouraged him to safely gradually become more active.

## 2011-04-20 NOTE — Assessment & Plan Note (Addendum)
Appears due to the vertigo. Reconsider if diarrhea become prominent. Check CBC, BMET due to CRF. Phenergan 25 mg given IM. Son with him to drive him home.

## 2011-04-25 ENCOUNTER — Other Ambulatory Visit: Payer: Self-pay | Admitting: Nephrology

## 2011-04-25 ENCOUNTER — Encounter (HOSPITAL_COMMUNITY): Payer: Medicare Other

## 2011-04-25 LAB — POCT HEMOGLOBIN-HEMACUE: Hemoglobin: 11.1 g/dL — ABNORMAL LOW (ref 13.0–17.0)

## 2011-04-25 LAB — IRON AND TIBC
Saturation Ratios: 21 % (ref 20–55)
UIBC: 205 ug/dL

## 2011-04-27 ENCOUNTER — Encounter: Payer: Self-pay | Admitting: Family Medicine

## 2011-04-27 ENCOUNTER — Ambulatory Visit (INDEPENDENT_AMBULATORY_CARE_PROVIDER_SITE_OTHER): Payer: Medicare Other | Admitting: Family Medicine

## 2011-04-27 DIAGNOSIS — H81399 Other peripheral vertigo, unspecified ear: Secondary | ICD-10-CM

## 2011-04-27 NOTE — Progress Notes (Signed)
  Subjective:    Patient ID: Scott Holmes, male    DOB: 1931/12/07, 77 y.o.   MRN: [id number]  HPI Patient here for follow up on vertigo for which he was seen here by Dr Sheffield Slider on June 14th  Since then has had no more vomiting, no more vertiginous spells.  Has been driving since 5/78 without problems.  Taking meclizine 3 times daily to prevent onset of vertigo (not because he has had symptoms).  No falls.   Review of Systems     Objective:   Physical Exam    Well appearing, no apparent distress.  Neck supple.  EOMI.  No nystagmus.  Gets up from sitting without assistance, feels well.  No unsteadiness of gait.  Rhomberg is normal.   COR RRR< no extra sounds.  PULM Clear bilat.     Assessment & Plan:

## 2011-04-27 NOTE — Patient Instructions (Signed)
It was a pleasure to see you today.  I ask that you not take the Meclizine unless you have the dizziness come back.  If it continues, please call me.

## 2011-04-27 NOTE — Assessment & Plan Note (Signed)
Patient with apparent peripheral vertigo, appears resolved.  Will ask him to discontinue the meclizine unless he has further symptoms.

## 2011-05-04 ENCOUNTER — Ambulatory Visit (INDEPENDENT_AMBULATORY_CARE_PROVIDER_SITE_OTHER): Payer: Medicare Other | Admitting: Home Health Services

## 2011-05-04 ENCOUNTER — Encounter: Payer: Self-pay | Admitting: Home Health Services

## 2011-05-04 VITALS — BP 148/74 | HR 70 | Temp 98.2°F | Ht 70.0 in | Wt 170.0 lb

## 2011-05-04 DIAGNOSIS — Z0000 Encounter for general adult medical examination without abnormal findings: Secondary | ICD-10-CM

## 2011-05-04 NOTE — Patient Instructions (Signed)
1. Focus on eating 3-4 vegetables a day. 2. Continue to walk every day. 3. Please bring in copy of your Living Will next time you see Dr. Mauricio Po.

## 2011-05-04 NOTE — Progress Notes (Signed)
Patient here for annual wellness visit, patient reports: Risk Factors/Conditions needing evaluation or treatment: Pt does not have any risk factors that need evaluation.  Home Safety: Pt lives in 1 story home with wife.  Pt reports having smoke detectors and adaptive equipment in bathroom. Other Information: Corrective lens: Pt uses corrective lens for driving and visits eye doctor every year. Dentures: Pt has full dentures. Memory: Pt denies any memory problems. Patient's Mini Mental Score (recorded in doc. flowsheet): 29  Balance/Gait: Pt does not have any noticeable impairments.  Balance Abnormal Patient value  Sitting balance    Sit to stand    Attempts to arise    Immediate standing balance    Standing balance    Nudge    Eyes closed- Romberg    Tandem stance    Back lean    Neck Rotation    360 degree turn    Sitting down     Gait Abnormal Patient value  Initiation of gait    Step length-left    Step length-right    Step height-left    Step height-right    Step symmetry    Step continuity    Path deviation    Trunk movement    Walking stance        Annual Wellness Visit Requirements Recorded Today In  Medical, family, social history Past Medical, Family, Social History Section  Current providers Care team  Current medications Medications  Wt, BP, Ht, BMI Vital signs  Hearing assessment (welcome visit) declined  Tobacco, alcohol, illicit drug use History  ADL Nurse Assessment  Depression Screening Nurse Assessment  Cognitive impairment Nurse Assessment  Mini Mental Status Document Flowsheet  Fall Risk Nurse Assessment  Home Safety Progress Note  End of Life Planning (welcome visit) Social Documentation  Medicare preventative services Progress Note  Risk factors/conditions needing evaluation/treatment Progress Note  Personalized health advice Patient Instructions, goals, letter  Diet & Exercise Social Documentation  Emergency Contact Social Documentation   Seat Belts Social Documentation  Sun exposure/protection Social Documentation    Medicare Prevention Plan: Pt is up to date or declined recommended screenings.   Recommended Medicare Prevention Screenings Men over 43 Test For Frequency Date of Last- BOLD if needed  Colorectal Cancer 1-10 yrs declined  Prostate Cancer Never or yearly Discuss with PCP  Aortic Aneurysm Once if 65-75 with hx of smoking Not indicated  Cholesterol 5 yrs Pt reported done  Diabetes yearly Non diabetic  HIV yearly declined  Influenza Shot yearly 2011  Pneumonia Shot once Pt reported done  Zostavax Shot once declined

## 2011-05-10 ENCOUNTER — Other Ambulatory Visit: Payer: Self-pay | Admitting: Nephrology

## 2011-05-10 ENCOUNTER — Encounter (HOSPITAL_COMMUNITY): Payer: Medicare Other | Attending: Nephrology

## 2011-05-10 DIAGNOSIS — D638 Anemia in other chronic diseases classified elsewhere: Secondary | ICD-10-CM | POA: Insufficient documentation

## 2011-05-10 DIAGNOSIS — N184 Chronic kidney disease, stage 4 (severe): Secondary | ICD-10-CM | POA: Insufficient documentation

## 2011-05-11 LAB — POCT HEMOGLOBIN-HEMACUE: Hemoglobin: 11 g/dL — ABNORMAL LOW (ref 13.0–17.0)

## 2011-05-23 ENCOUNTER — Other Ambulatory Visit: Payer: Self-pay | Admitting: Nephrology

## 2011-05-23 ENCOUNTER — Encounter (HOSPITAL_COMMUNITY): Payer: Medicare Other

## 2011-05-23 LAB — COMPREHENSIVE METABOLIC PANEL
AST: 13 U/L (ref 0–37)
Alkaline Phosphatase: 64 U/L (ref 39–117)
BUN: 43 mg/dL — ABNORMAL HIGH (ref 6–23)
CO2: 25 mEq/L (ref 19–32)
Chloride: 107 mEq/L (ref 96–112)
Creatinine, Ser: 2.83 mg/dL — ABNORMAL HIGH (ref 0.50–1.35)
GFR calc non Af Amer: 22 mL/min — ABNORMAL LOW (ref >60)
Potassium: 4 mEq/L (ref 3.5–5.1)
Total Bilirubin: 0.2 mg/dL — ABNORMAL LOW (ref 0.3–1.2)

## 2011-05-23 LAB — URIC ACID: Uric Acid, Serum: 6.3 mg/dL (ref 4.0–7.8)

## 2011-05-23 LAB — POCT HEMOGLOBIN-HEMACUE: Hemoglobin: 11.5 g/dL — ABNORMAL LOW (ref 13.0–17.0)

## 2011-05-23 LAB — IRON AND TIBC: UIBC: 208 ug/dL

## 2011-06-06 ENCOUNTER — Other Ambulatory Visit: Payer: Self-pay | Admitting: Nephrology

## 2011-06-06 ENCOUNTER — Encounter (HOSPITAL_COMMUNITY): Payer: Medicare Other | Attending: Nephrology

## 2011-06-06 ENCOUNTER — Encounter: Payer: Self-pay | Admitting: Home Health Services

## 2011-06-06 DIAGNOSIS — N184 Chronic kidney disease, stage 4 (severe): Secondary | ICD-10-CM | POA: Insufficient documentation

## 2011-06-06 DIAGNOSIS — D638 Anemia in other chronic diseases classified elsewhere: Secondary | ICD-10-CM | POA: Insufficient documentation

## 2011-06-06 LAB — POCT HEMOGLOBIN-HEMACUE: Hemoglobin: 11.8 g/dL — ABNORMAL LOW (ref 13.0–17.0)

## 2011-06-06 NOTE — Progress Notes (Signed)
  Subjective:    Patient ID: Scott Holmes, male    DOB: 1932/07/09, 77 y.o.   MRN: [id number]  HPI    Review of Systems     Objective:   Physical Exam  I have reviewed this visit and discussed with Scott Holmes and agree with her documentation.         Assessment & Plan:

## 2011-06-20 ENCOUNTER — Other Ambulatory Visit: Payer: Self-pay | Admitting: Nephrology

## 2011-06-20 ENCOUNTER — Encounter (HOSPITAL_COMMUNITY): Payer: Medicare Other

## 2011-06-20 LAB — FERRITIN: Ferritin: 68 ng/mL (ref 22–322)

## 2011-06-20 LAB — IRON AND TIBC
Saturation Ratios: 23 % (ref 20–55)
UIBC: 216 ug/dL

## 2011-06-21 LAB — POCT HEMOGLOBIN-HEMACUE: Hemoglobin: 11.6 g/dL — ABNORMAL LOW (ref 13.0–17.0)

## 2011-07-04 ENCOUNTER — Encounter (HOSPITAL_COMMUNITY): Payer: Medicare Other

## 2011-07-04 ENCOUNTER — Other Ambulatory Visit: Payer: Self-pay | Admitting: Nephrology

## 2011-07-18 ENCOUNTER — Other Ambulatory Visit: Payer: Self-pay | Admitting: Nephrology

## 2011-07-18 ENCOUNTER — Encounter (HOSPITAL_COMMUNITY): Payer: Medicare Other | Attending: Nephrology

## 2011-07-18 DIAGNOSIS — N184 Chronic kidney disease, stage 4 (severe): Secondary | ICD-10-CM | POA: Insufficient documentation

## 2011-07-18 DIAGNOSIS — D638 Anemia in other chronic diseases classified elsewhere: Secondary | ICD-10-CM | POA: Insufficient documentation

## 2011-07-18 LAB — IRON AND TIBC
Iron: 55 ug/dL (ref 42–135)
UIBC: 225 ug/dL (ref 125–400)

## 2011-07-18 LAB — POCT HEMOGLOBIN-HEMACUE: Hemoglobin: 12.2 g/dL — ABNORMAL LOW (ref 13.0–17.0)

## 2011-07-18 LAB — FERRITIN: Ferritin: 75 ng/mL (ref 22–322)

## 2011-07-26 LAB — IRON AND TIBC
Saturation Ratios: 24
TIBC: 238

## 2011-07-26 LAB — CBC
HCT: 30.1 — ABNORMAL LOW
Hemoglobin: 9.9 — ABNORMAL LOW
MCHC: 33
RBC: 3.42 — ABNORMAL LOW
RDW: 18.5 — ABNORMAL HIGH

## 2011-07-27 ENCOUNTER — Ambulatory Visit (INDEPENDENT_AMBULATORY_CARE_PROVIDER_SITE_OTHER): Payer: Medicare Other | Admitting: *Deleted

## 2011-07-27 DIAGNOSIS — Z23 Encounter for immunization: Secondary | ICD-10-CM

## 2011-07-30 LAB — IRON AND TIBC
Iron: 50
Iron: 32 — ABNORMAL LOW
Saturation Ratios: 14 — ABNORMAL LOW
TIBC: 223
UIBC: 170
UIBC: 191

## 2011-07-30 LAB — RENAL FUNCTION PANEL
Albumin: 3 — ABNORMAL LOW
BUN: 24 — ABNORMAL HIGH
BUN: 24 — ABNORMAL HIGH
CO2: 25
Calcium: 9
Chloride: 101
Creatinine, Ser: 2.13 — ABNORMAL HIGH
Glucose, Bld: 109 — ABNORMAL HIGH
Glucose, Bld: 179 — ABNORMAL HIGH
Phosphorus: 4
Phosphorus: 3.9
Potassium: 3.6
Sodium: 140

## 2011-07-30 LAB — CBC
HCT: 29.3 — ABNORMAL LOW
Hemoglobin: 10 — ABNORMAL LOW
MCHC: 33.1
MCV: 86.7
Platelets: 544 — ABNORMAL HIGH
RBC: 3.47 — ABNORMAL LOW
WBC: 8.6
WBC: 9

## 2011-07-30 LAB — PTH, INTACT AND CALCIUM: PTH: 46.6

## 2011-07-30 LAB — FERRITIN
Ferritin: 672 — ABNORMAL HIGH (ref 22–322)
Ferritin: 596 — ABNORMAL HIGH (ref 22–322)

## 2011-07-31 LAB — CBC
HCT: 31.3 — ABNORMAL LOW
MCHC: 32.9
MCV: 89.2
Platelets: 378
RDW: 19.4 — ABNORMAL HIGH
WBC: 9.4

## 2011-07-31 LAB — RENAL FUNCTION PANEL
Chloride: 102
Glucose, Bld: 179 — ABNORMAL HIGH
Phosphorus: 3.6
Potassium: 3.6
Sodium: 139

## 2011-07-31 LAB — IRON AND TIBC: UIBC: 172

## 2011-08-01 ENCOUNTER — Other Ambulatory Visit: Payer: Self-pay | Admitting: Nephrology

## 2011-08-01 ENCOUNTER — Encounter (HOSPITAL_COMMUNITY)
Admission: RE | Admit: 2011-08-01 | Discharge: 2011-08-01 | Payer: Medicare Other | Source: Ambulatory Visit | Attending: Nephrology | Admitting: Nephrology

## 2011-08-01 LAB — POCT HEMOGLOBIN-HEMACUE: Hemoglobin: 12.3 g/dL — ABNORMAL LOW (ref 13.0–17.0)

## 2011-08-02 LAB — IRON AND TIBC
Iron: 13 — ABNORMAL LOW
Saturation Ratios: 6 — ABNORMAL LOW
UIBC: 212

## 2011-08-02 LAB — CBC
HCT: 31.1 — ABNORMAL LOW
Hemoglobin: 10.6 — ABNORMAL LOW
MCHC: 33.1
MCV: 91.1
Platelets: 324
RBC: 3.49 — ABNORMAL LOW
RBC: 3.53 — ABNORMAL LOW
WBC: 11.4 — ABNORMAL HIGH

## 2011-08-02 LAB — RENAL FUNCTION PANEL
Albumin: 3.3 — ABNORMAL LOW
Chloride: 102
GFR calc Af Amer: 29 — ABNORMAL LOW
GFR calc non Af Amer: 24 — ABNORMAL LOW
Potassium: 3.8

## 2011-08-02 LAB — FERRITIN
Ferritin: 458 — ABNORMAL HIGH (ref 22–322)
Ferritin: 594 — ABNORMAL HIGH (ref 22–322)

## 2011-08-02 LAB — PTH, INTACT AND CALCIUM: Calcium, Total (PTH): 8.6

## 2011-08-03 LAB — RENAL FUNCTION PANEL
Albumin: 3.4 — ABNORMAL LOW
BUN: 22
Chloride: 105
GFR calc Af Amer: 28 — ABNORMAL LOW
GFR calc non Af Amer: 23 — ABNORMAL LOW
Phosphorus: 4.3
Potassium: 3.7
Sodium: 142

## 2011-08-03 LAB — CBC
Hemoglobin: 11.4 — ABNORMAL LOW
RBC: 3.65 — ABNORMAL LOW
RDW: 20.1 — ABNORMAL HIGH
WBC: 7.1

## 2011-08-03 LAB — FERRITIN: Ferritin: 733 — ABNORMAL HIGH (ref 22–322)

## 2011-08-03 LAB — IRON AND TIBC: UIBC: 195

## 2011-08-06 LAB — POCT HEMOGLOBIN-HEMACUE: Hemoglobin: 10.5 — ABNORMAL LOW

## 2011-08-07 LAB — COMPREHENSIVE METABOLIC PANEL
AST: 14 U/L (ref 0–37)
Albumin: 3.6 g/dL (ref 3.5–5.2)
Alkaline Phosphatase: 79 U/L (ref 39–117)
BUN: 32 mg/dL — ABNORMAL HIGH (ref 6–23)
Creatinine, Ser: 2.35 mg/dL — ABNORMAL HIGH (ref 0.4–1.5)
GFR calc Af Amer: 33 mL/min — ABNORMAL LOW (ref >60)
Potassium: 3.7 mEq/L (ref 3.5–5.1)
Total Protein: 6.3 g/dL (ref 6.0–8.3)

## 2011-08-07 LAB — RENAL FUNCTION PANEL
Albumin: 3.4 — ABNORMAL LOW
CO2: 26 mEq/L (ref 19–32)
Chloride: 102
Creatinine, Ser: 2.41 — ABNORMAL HIGH
GFR calc Af Amer: 32 — ABNORMAL LOW
GFR calc non Af Amer: 26 — ABNORMAL LOW
Glucose, Bld: 113 mg/dL — ABNORMAL HIGH (ref 70–99)
Potassium: 4.2 mEq/L (ref 3.5–5.1)
Sodium: 136
Sodium: 142 mEq/L (ref 135–145)

## 2011-08-07 LAB — SEDIMENTATION RATE: Sed Rate: 8 mm/hr (ref 0–16)

## 2011-08-07 LAB — IRON AND TIBC
Iron: 95
Iron: 125 ug/dL (ref 42–135)
TIBC: 246
TIBC: 261 ug/dL (ref 215–435)

## 2011-08-07 LAB — PTH, INTACT AND CALCIUM: Calcium, Total (PTH): 9.3

## 2011-08-07 LAB — FERRITIN: Ferritin: 585 — ABNORMAL HIGH (ref 22–322)

## 2011-08-07 LAB — URIC ACID: Uric Acid, Serum: 9.6 mg/dL — ABNORMAL HIGH (ref 4.0–7.8)

## 2011-08-07 LAB — POCT HEMOGLOBIN-HEMACUE
Hemoglobin: 11.4 g/dL — ABNORMAL LOW (ref 13.0–17.0)
Hemoglobin: 12.1 g/dL — ABNORMAL LOW (ref 13.0–17.0)

## 2011-08-08 LAB — RENAL FUNCTION PANEL
CO2: 29
Chloride: 101
Glucose, Bld: 112 — ABNORMAL HIGH
Phosphorus: 4.2
Potassium: 3.8
Sodium: 140

## 2011-08-08 LAB — IRON AND TIBC
Iron: 105
Saturation Ratios: 43
TIBC: 243
UIBC: 138

## 2011-08-08 LAB — PTH, INTACT AND CALCIUM: PTH: 30.5

## 2011-08-10 LAB — DIFFERENTIAL
Basophils Relative: 0 % (ref 0–1)
Lymphs Abs: 1 10*3/uL (ref 0.7–4.0)
Monocytes Absolute: 0.7 10*3/uL (ref 0.1–1.0)
Monocytes Relative: 8 % (ref 3–12)
Neutro Abs: 7.3 10*3/uL (ref 1.7–7.7)

## 2011-08-10 LAB — COMPREHENSIVE METABOLIC PANEL
ALT: 13 U/L (ref 0–53)
Albumin: 3.6 g/dL (ref 3.5–5.2)
Alkaline Phosphatase: 74 U/L (ref 39–117)
BUN: 22 mg/dL (ref 6–23)
Calcium: 9.1 mg/dL (ref 8.4–10.5)
Potassium: 3.8 mEq/L (ref 3.5–5.1)
Sodium: 142 mEq/L (ref 135–145)
Total Protein: 6.7 g/dL (ref 6.0–8.3)

## 2011-08-10 LAB — RENAL FUNCTION PANEL
Albumin: 3.6 g/dL (ref 3.5–5.2)
BUN: 22 mg/dL (ref 6–23)
BUN: 25 — ABNORMAL HIGH
Calcium: 9.1 mg/dL (ref 8.4–10.5)
Chloride: 103 mEq/L (ref 96–112)
Chloride: 103
Creatinine, Ser: 2.67 mg/dL — ABNORMAL HIGH (ref 0.4–1.5)
GFR calc Af Amer: 28 mL/min — ABNORMAL LOW (ref >60)
GFR calc non Af Amer: 24 mL/min — ABNORMAL LOW (ref >60)
Glucose, Bld: 108 — ABNORMAL HIGH
Phosphorus: 4 mg/dL (ref 2.3–4.6)
Phosphorus: 4.1
Potassium: 3.5
Sodium: 139

## 2011-08-10 LAB — CBC
HCT: 29.7 — ABNORMAL LOW
MCHC: 34.2 g/dL (ref 30.0–36.0)
MCHC: 32.3
MCV: 88
Platelets: 235 10*3/uL (ref 150–400)
Platelets: 489 — ABNORMAL HIGH
RDW: 19.1 % — ABNORMAL HIGH (ref 11.5–15.5)
RDW: 18 — ABNORMAL HIGH

## 2011-08-10 LAB — IRON AND TIBC
Iron: 90 ug/dL (ref 42–135)
Iron: 33 — ABNORMAL LOW
TIBC: 268 ug/dL (ref 215–435)
UIBC: 178 ug/dL
UIBC: 173

## 2011-08-10 LAB — FERRITIN: Ferritin: 706 ng/mL — ABNORMAL HIGH (ref 22–322)

## 2011-08-14 LAB — CBC
Hemoglobin: 9.4 — ABNORMAL LOW
MCHC: 32.6
MCV: 90.3
RBC: 3.18 — ABNORMAL LOW
WBC: 9.1

## 2011-08-14 LAB — RENAL FUNCTION PANEL
CO2: 27
Calcium: 9
Creatinine, Ser: 2.26 — ABNORMAL HIGH
GFR calc Af Amer: 35 — ABNORMAL LOW
Glucose, Bld: 196 — ABNORMAL HIGH
Phosphorus: 3.2

## 2011-08-14 LAB — PTH, INTACT AND CALCIUM: PTH: 17.2

## 2011-08-14 LAB — IRON AND TIBC
Iron: 34 — ABNORMAL LOW
TIBC: 208 — ABNORMAL LOW
UIBC: 174

## 2011-08-14 LAB — FERRITIN: Ferritin: 857 — ABNORMAL HIGH (ref 22–322)

## 2011-08-15 ENCOUNTER — Other Ambulatory Visit: Payer: Self-pay | Admitting: Nephrology

## 2011-08-15 ENCOUNTER — Encounter (HOSPITAL_COMMUNITY)
Admission: RE | Admit: 2011-08-15 | Discharge: 2011-08-15 | Disposition: A | Discharge status: Home / Self Care | Payer: Medicare Other | Source: Ambulatory Visit | Attending: Nephrology | Admitting: Nephrology

## 2011-08-15 DIAGNOSIS — N184 Chronic kidney disease, stage 4 (severe): Secondary | ICD-10-CM | POA: Insufficient documentation

## 2011-08-15 DIAGNOSIS — D638 Anemia in other chronic diseases classified elsewhere: Secondary | ICD-10-CM | POA: Insufficient documentation

## 2011-08-15 LAB — IRON AND TIBC
Iron: 45
Iron: 57 ug/dL (ref 42–135)
Saturation Ratios: 22
TIBC: 202 — ABNORMAL LOW
UIBC: 217 ug/dL (ref 125–400)

## 2011-08-15 LAB — URIC ACID: Uric Acid, Serum: 6.1 mg/dL (ref 4.0–7.8)

## 2011-08-15 LAB — POCT HEMOGLOBIN-HEMACUE: Hemoglobin: 12.2 g/dL — ABNORMAL LOW (ref 13.0–17.0)

## 2011-08-15 LAB — RENAL FUNCTION PANEL
BUN: 26 — ABNORMAL HIGH
CO2: 27
Calcium: 9
Glucose, Bld: 185 — ABNORMAL HIGH
Sodium: 138

## 2011-08-15 LAB — COMPREHENSIVE METABOLIC PANEL
CO2: 26 mEq/L (ref 19–32)
Calcium: 9.2 mg/dL (ref 8.4–10.5)
Creatinine, Ser: 3.15 mg/dL — ABNORMAL HIGH (ref 0.50–1.35)
GFR calc Af Amer: 20 mL/min — ABNORMAL LOW (ref >90)
GFR calc non Af Amer: 18 mL/min — ABNORMAL LOW (ref >90)
Glucose, Bld: 129 mg/dL — ABNORMAL HIGH (ref 70–99)
Total Protein: 7 g/dL (ref 6.0–8.3)

## 2011-08-15 LAB — CBC
Platelets: 406 — ABNORMAL HIGH
RDW: 18.9 — ABNORMAL HIGH

## 2011-08-15 LAB — PHOSPHORUS: Phosphorus: 3.6 mg/dL (ref 2.3–4.6)

## 2011-08-16 LAB — IRON AND TIBC
Iron: 24 — ABNORMAL LOW
Saturation Ratios: 11 — ABNORMAL LOW
TIBC: 217
UIBC: 193

## 2011-08-16 LAB — RENAL FUNCTION PANEL
BUN: 25 — ABNORMAL HIGH
CO2: 30
Chloride: 100
Glucose, Bld: 118 — ABNORMAL HIGH
Phosphorus: 4.4
Potassium: 3.4 — ABNORMAL LOW
Sodium: 140

## 2011-08-16 LAB — CBC
HCT: 27 — ABNORMAL LOW
Platelets: 537 — ABNORMAL HIGH
WBC: 10.2

## 2011-08-16 LAB — FERRITIN: Ferritin: 677 — ABNORMAL HIGH (ref 22–322)

## 2011-08-17 LAB — CBC
MCV: 93.6
Platelets: 503 — ABNORMAL HIGH
RBC: 2.61 — ABNORMAL LOW
WBC: 8.7

## 2011-08-17 LAB — IRON AND TIBC
Iron: 42
TIBC: 217
UIBC: 175

## 2011-08-17 LAB — RENAL FUNCTION PANEL
BUN: 36 — ABNORMAL HIGH
Chloride: 102
Creatinine, Ser: 2.96 — ABNORMAL HIGH
Glucose, Bld: 111 — ABNORMAL HIGH
Phosphorus: 4.9 — ABNORMAL HIGH
Potassium: 3.7

## 2011-08-20 LAB — RENAL FUNCTION PANEL
BUN: 58 — ABNORMAL HIGH
CO2: 23
Chloride: 104
Chloride: 107
Glucose, Bld: 103 — ABNORMAL HIGH
Glucose, Bld: 106 — ABNORMAL HIGH
Potassium: 4.1
Potassium: 5.6 — ABNORMAL HIGH
Sodium: 140

## 2011-08-20 LAB — CBC
Hemoglobin: 9.3 — ABNORMAL LOW
MCHC: 33.2
MCV: 92.8
RBC: 3.02 — ABNORMAL LOW
RDW: 16.8 — ABNORMAL HIGH

## 2011-08-21 LAB — BASIC METABOLIC PANEL
CO2: 22
Calcium: 11.5 — ABNORMAL HIGH
Chloride: 107
GFR calc Af Amer: 13 — ABNORMAL LOW
Potassium: 6.5
Sodium: 140

## 2011-08-21 LAB — CBC
Hemoglobin: 9.7 — ABNORMAL LOW
MCHC: 33.2
MCV: 92.5
RBC: 3.16 — ABNORMAL LOW

## 2011-08-21 LAB — IRON AND TIBC: TIBC: 247

## 2011-08-21 LAB — FERRITIN: Ferritin: 1167 — ABNORMAL HIGH (ref 22–322)

## 2011-08-23 LAB — IRON AND TIBC
Iron: 27 — ABNORMAL LOW
Saturation Ratios: 16 — ABNORMAL LOW
TIBC: 166 — ABNORMAL LOW
UIBC: 139

## 2011-08-23 LAB — RENAL FUNCTION PANEL
Albumin: 3.1 — ABNORMAL LOW
BUN: 57 — ABNORMAL HIGH
CO2: 22
Calcium: 10.1
Chloride: 109
Creatinine, Ser: 4.24 — ABNORMAL HIGH
GFR calc Af Amer: 17 — ABNORMAL LOW
GFR calc non Af Amer: 14 — ABNORMAL LOW
Glucose, Bld: 98
Phosphorus: 5.8 — ABNORMAL HIGH
Potassium: 5.1
Sodium: 143

## 2011-08-23 LAB — CBC
HCT: 28.8 — ABNORMAL LOW
Hemoglobin: 9.6 — ABNORMAL LOW
MCHC: 33.5
MCV: 92.6
Platelets: 416 — ABNORMAL HIGH
RBC: 3.11 — ABNORMAL LOW
RDW: 17.4 — ABNORMAL HIGH
WBC: 7.8

## 2011-08-29 ENCOUNTER — Other Ambulatory Visit: Payer: Self-pay | Admitting: Nephrology

## 2011-08-29 ENCOUNTER — Encounter (HOSPITAL_COMMUNITY): Payer: Medicare Other

## 2011-09-12 ENCOUNTER — Encounter (HOSPITAL_COMMUNITY)
Admission: RE | Admit: 2011-09-12 | Discharge: 2011-09-12 | Disposition: A | Discharge status: Home / Self Care | Payer: Medicare Other | Source: Ambulatory Visit | Attending: Nephrology | Admitting: Nephrology

## 2011-09-12 DIAGNOSIS — N184 Chronic kidney disease, stage 4 (severe): Secondary | ICD-10-CM | POA: Insufficient documentation

## 2011-09-12 DIAGNOSIS — D638 Anemia in other chronic diseases classified elsewhere: Secondary | ICD-10-CM | POA: Insufficient documentation

## 2011-09-12 LAB — POCT HEMOGLOBIN-HEMACUE: Hemoglobin: 11 g/dL — ABNORMAL LOW (ref 13.0–17.0)

## 2011-09-12 LAB — IRON AND TIBC: UIBC: 189 ug/dL (ref 125–400)

## 2011-09-12 MED ORDER — EPOETIN ALFA 10000 UNIT/ML IJ SOLN
10000.0000 [IU] | INTRAMUSCULAR | Status: DC
  Filled 2011-09-12: qty 1

## 2011-09-12 NOTE — Progress Notes (Addendum)
ipth released but not done per Rosalio Macadamia, RN

## 2011-09-13 MED FILL — Epoetin Alfa Inj 10000 Unit/ML: INTRAMUSCULAR | Qty: 1 | Status: AC

## 2011-09-26 ENCOUNTER — Encounter (HOSPITAL_COMMUNITY)
Admission: RE | Admit: 2011-09-26 | Discharge: 2011-09-26 | Disposition: A | Discharge status: Home / Self Care | Payer: Medicare Other | Source: Ambulatory Visit | Attending: Nephrology | Admitting: Nephrology

## 2011-09-26 LAB — POCT HEMOGLOBIN-HEMACUE: Hemoglobin: 11.2 g/dL — ABNORMAL LOW (ref 13.0–17.0)

## 2011-09-26 MED ORDER — EPOETIN ALFA 10000 UNIT/ML IJ SOLN
10000.0000 [IU] | INTRAMUSCULAR | Status: DC
  Administered 2011-09-26: 10000 [IU] via SUBCUTANEOUS

## 2011-09-26 MED ORDER — EPOETIN ALFA 10000 UNIT/ML IJ SOLN
INTRAMUSCULAR | Status: AC
  Administered 2011-09-26: 10000 [IU] via SUBCUTANEOUS
  Filled 2011-09-26: qty 1

## 2011-09-29 ENCOUNTER — Other Ambulatory Visit: Payer: Self-pay | Admitting: Family Medicine

## 2011-09-30 NOTE — Telephone Encounter (Signed)
Refill request

## 2011-10-02 ENCOUNTER — Other Ambulatory Visit: Payer: Self-pay | Admitting: Family Medicine

## 2011-10-02 DIAGNOSIS — E785 Hyperlipidemia, unspecified: Secondary | ICD-10-CM

## 2011-10-05 ENCOUNTER — Other Ambulatory Visit (HOSPITAL_COMMUNITY): Payer: Self-pay | Admitting: *Deleted

## 2011-10-10 ENCOUNTER — Other Ambulatory Visit: Payer: Medicare Other

## 2011-10-10 ENCOUNTER — Encounter (HOSPITAL_COMMUNITY)
Admission: RE | Admit: 2011-10-10 | Discharge: 2011-10-10 | Disposition: A | Discharge status: Home / Self Care | Payer: Medicare Other | Source: Ambulatory Visit | Attending: Nephrology | Admitting: Nephrology

## 2011-10-10 DIAGNOSIS — E785 Hyperlipidemia, unspecified: Secondary | ICD-10-CM

## 2011-10-10 DIAGNOSIS — N184 Chronic kidney disease, stage 4 (severe): Secondary | ICD-10-CM | POA: Insufficient documentation

## 2011-10-10 DIAGNOSIS — D638 Anemia in other chronic diseases classified elsewhere: Secondary | ICD-10-CM | POA: Insufficient documentation

## 2011-10-10 LAB — LIPID PANEL
Cholesterol: 185 mg/dL (ref 0–200)
HDL: 36 mg/dL — ABNORMAL LOW (ref >39)
Total CHOL/HDL Ratio: 5.1 Ratio
Triglycerides: 142 mg/dL (ref <150)
VLDL: 28 mg/dL (ref 0–40)

## 2011-10-10 LAB — POCT HEMOGLOBIN-HEMACUE: Hemoglobin: 11.5 g/dL — ABNORMAL LOW (ref 13.0–17.0)

## 2011-10-10 MED ORDER — EPOETIN ALFA 10000 UNIT/ML IJ SOLN
INTRAMUSCULAR | Status: AC
  Administered 2011-10-10: 10000 [IU] via SUBCUTANEOUS
  Filled 2011-10-10: qty 1

## 2011-10-10 MED ORDER — EPOETIN ALFA 10000 UNIT/ML IJ SOLN
10000.0000 [IU] | INTRAMUSCULAR | Status: DC
  Administered 2011-10-10: 10000 [IU] via SUBCUTANEOUS

## 2011-10-10 NOTE — Progress Notes (Signed)
FLP DONE TODAY Angelly Spearing 

## 2011-10-15 ENCOUNTER — Telehealth: Payer: Self-pay | Admitting: Family Medicine

## 2011-10-15 NOTE — Telephone Encounter (Signed)
Called Mr. Lewan to report lipid results.  His HDL low at 36, LDL a little elevated at 121.  He admits to not remembering to take his meds every day; no particular side effects that he notes.  "I remember to give my wife all her meds, but I forget to take mine."  WIll try to take more regularly, then to check LDL in another 2-3 months.  Review of CMet results shows that in Oct 2012 has normal transaminases. Arish Redner O

## 2011-10-24 ENCOUNTER — Encounter (HOSPITAL_COMMUNITY)
Admission: RE | Admit: 2011-10-24 | Discharge: 2011-10-24 | Disposition: A | Discharge status: Home / Self Care | Payer: Medicare Other | Source: Ambulatory Visit | Attending: Nephrology | Admitting: Nephrology

## 2011-10-24 MED ORDER — EPOETIN ALFA 10000 UNIT/ML IJ SOLN
INTRAMUSCULAR | Status: AC
  Administered 2011-10-24: 10000 [IU] via SUBCUTANEOUS
  Filled 2011-10-24: qty 1

## 2011-10-24 MED ORDER — EPOETIN ALFA 10000 UNIT/ML IJ SOLN
10000.0000 [IU] | INTRAMUSCULAR | Status: DC
  Administered 2011-10-24: 10000 [IU] via SUBCUTANEOUS

## 2011-11-07 ENCOUNTER — Encounter (HOSPITAL_COMMUNITY)
Admission: RE | Admit: 2011-11-07 | Discharge: 2011-11-07 | Disposition: A | Discharge status: Home / Self Care | Payer: Medicare Other | Source: Ambulatory Visit | Attending: Nephrology | Admitting: Nephrology

## 2011-11-07 DIAGNOSIS — N184 Chronic kidney disease, stage 4 (severe): Secondary | ICD-10-CM | POA: Insufficient documentation

## 2011-11-07 DIAGNOSIS — D638 Anemia in other chronic diseases classified elsewhere: Secondary | ICD-10-CM | POA: Insufficient documentation

## 2011-11-07 LAB — IRON AND TIBC: Iron: 74 ug/dL (ref 42–135)

## 2011-11-07 MED ORDER — EPOETIN ALFA 10000 UNIT/ML IJ SOLN
10000.0000 [IU] | INTRAMUSCULAR | Status: DC
  Administered 2011-11-07: 10000 [IU] via SUBCUTANEOUS

## 2011-11-07 MED ORDER — EPOETIN ALFA 10000 UNIT/ML IJ SOLN
INTRAMUSCULAR | Status: AC
  Filled 2011-11-07: qty 1

## 2011-11-19 ENCOUNTER — Other Ambulatory Visit (HOSPITAL_COMMUNITY): Payer: Self-pay | Admitting: *Deleted

## 2011-11-21 ENCOUNTER — Encounter (HOSPITAL_COMMUNITY)
Admission: RE | Admit: 2011-11-21 | Discharge: 2011-11-21 | Disposition: A | Discharge status: Home / Self Care | Payer: Medicare Other | Source: Ambulatory Visit | Attending: Nephrology | Admitting: Nephrology

## 2011-11-21 LAB — PHOSPHORUS: Phosphorus: 3.6 mg/dL (ref 2.3–4.6)

## 2011-11-21 LAB — COMPREHENSIVE METABOLIC PANEL
AST: 15 U/L (ref 0–37)
Albumin: 3.4 g/dL — ABNORMAL LOW (ref 3.5–5.2)
Alkaline Phosphatase: 61 U/L (ref 39–117)
BUN: 52 mg/dL — ABNORMAL HIGH (ref 6–23)
Chloride: 106 mEq/L (ref 96–112)
Potassium: 3.8 mEq/L (ref 3.5–5.1)
Sodium: 142 mEq/L (ref 135–145)
Total Bilirubin: 0.2 mg/dL — ABNORMAL LOW (ref 0.3–1.2)
Total Protein: 6.7 g/dL (ref 6.0–8.3)

## 2011-11-21 LAB — IRON AND TIBC
Saturation Ratios: 26 % (ref 20–55)
TIBC: 269 ug/dL (ref 215–435)
UIBC: 198 ug/dL (ref 125–400)

## 2011-11-21 LAB — URIC ACID: Uric Acid, Serum: 6 mg/dL (ref 4.0–7.8)

## 2011-11-21 MED ORDER — EPOETIN ALFA 10000 UNIT/ML IJ SOLN
INTRAMUSCULAR | Status: AC
  Administered 2011-11-21: 10000 [IU] via SUBCUTANEOUS
  Filled 2011-11-21: qty 1

## 2011-11-23 ENCOUNTER — Other Ambulatory Visit: Payer: Self-pay | Admitting: Family Medicine

## 2011-11-23 NOTE — Telephone Encounter (Signed)
Refill request

## 2011-12-05 ENCOUNTER — Encounter (HOSPITAL_COMMUNITY)
Admission: RE | Admit: 2011-12-05 | Discharge: 2011-12-05 | Disposition: A | Discharge status: Home / Self Care | Payer: Medicare Other | Source: Ambulatory Visit | Attending: Nephrology | Admitting: Nephrology

## 2011-12-05 LAB — POCT HEMOGLOBIN-HEMACUE: Hemoglobin: 10.2 g/dL — ABNORMAL LOW (ref 13.0–17.0)

## 2011-12-05 MED ORDER — EPOETIN ALFA 10000 UNIT/ML IJ SOLN
10000.0000 [IU] | INTRAMUSCULAR | Status: DC
  Administered 2011-12-05: 10000 [IU] via SUBCUTANEOUS

## 2011-12-05 MED ORDER — EPOETIN ALFA 10000 UNIT/ML IJ SOLN
INTRAMUSCULAR | Status: AC
  Administered 2011-12-05: 10000 [IU] via SUBCUTANEOUS
  Filled 2011-12-05: qty 1

## 2011-12-12 ENCOUNTER — Inpatient Hospital Stay (HOSPITAL_COMMUNITY): Admission: RE | Admit: 2011-12-12 | Payer: Medicare Other | Source: Ambulatory Visit

## 2011-12-19 ENCOUNTER — Encounter (HOSPITAL_COMMUNITY)
Admission: RE | Admit: 2011-12-19 | Discharge: 2011-12-19 | Disposition: A | Discharge status: Home / Self Care | Payer: Medicare Other | Source: Ambulatory Visit | Attending: Nephrology | Admitting: Nephrology

## 2011-12-19 DIAGNOSIS — N184 Chronic kidney disease, stage 4 (severe): Secondary | ICD-10-CM | POA: Insufficient documentation

## 2011-12-19 DIAGNOSIS — D638 Anemia in other chronic diseases classified elsewhere: Secondary | ICD-10-CM | POA: Insufficient documentation

## 2011-12-19 LAB — IRON AND TIBC
Saturation Ratios: 28 % (ref 20–55)
UIBC: 192 ug/dL (ref 125–400)

## 2011-12-19 LAB — POCT HEMOGLOBIN-HEMACUE: Hemoglobin: 11.4 g/dL — ABNORMAL LOW (ref 13.0–17.0)

## 2011-12-19 MED ORDER — EPOETIN ALFA 10000 UNIT/ML IJ SOLN
INTRAMUSCULAR | Status: AC
  Administered 2011-12-19: 10000 [IU] via SUBCUTANEOUS
  Filled 2011-12-19: qty 1

## 2011-12-19 MED ORDER — EPOETIN ALFA 10000 UNIT/ML IJ SOLN: 10000.0000 [IU] | INTRAMUSCULAR | Status: DC

## 2011-12-26 ENCOUNTER — Encounter (HOSPITAL_COMMUNITY): Payer: Medicare Other

## 2012-01-02 ENCOUNTER — Encounter (HOSPITAL_COMMUNITY)
Admission: RE | Admit: 2012-01-02 | Discharge: 2012-01-02 | Disposition: A | Discharge status: Home / Self Care | Payer: Medicare Other | Source: Ambulatory Visit | Attending: Nephrology | Admitting: Nephrology

## 2012-01-02 LAB — POCT HEMOGLOBIN-HEMACUE: Hemoglobin: 10.7 g/dL — ABNORMAL LOW (ref 13.0–17.0)

## 2012-01-02 MED ORDER — EPOETIN ALFA 10000 UNIT/ML IJ SOLN
10000.0000 [IU] | INTRAMUSCULAR | Status: DC
Start: 2012-01-02 — End: 2012-01-03
  Administered 2012-01-02: 10000 [IU] via SUBCUTANEOUS
  Filled 2012-01-02: qty 1

## 2012-01-03 ENCOUNTER — Other Ambulatory Visit: Payer: Self-pay | Admitting: Family Medicine

## 2012-01-03 NOTE — Telephone Encounter (Signed)
Refill request

## 2012-01-09 ENCOUNTER — Encounter (HOSPITAL_COMMUNITY): Payer: Medicare Other

## 2012-01-16 ENCOUNTER — Encounter (HOSPITAL_COMMUNITY)
Admission: RE | Admit: 2012-01-16 | Discharge: 2012-01-16 | Disposition: A | Discharge status: Home / Self Care | Payer: Medicare Other | Source: Ambulatory Visit | Attending: Nephrology | Admitting: Nephrology

## 2012-01-16 DIAGNOSIS — D638 Anemia in other chronic diseases classified elsewhere: Secondary | ICD-10-CM | POA: Insufficient documentation

## 2012-01-16 DIAGNOSIS — N184 Chronic kidney disease, stage 4 (severe): Secondary | ICD-10-CM | POA: Insufficient documentation

## 2012-01-16 LAB — POCT HEMOGLOBIN-HEMACUE: Hemoglobin: 11 g/dL — ABNORMAL LOW (ref 13.0–17.0)

## 2012-01-16 LAB — IRON AND TIBC: Iron: 78 ug/dL (ref 42–135)

## 2012-01-16 MED ORDER — EPOETIN ALFA 10000 UNIT/ML IJ SOLN
10000.0000 [IU] | INTRAMUSCULAR | Status: DC
  Administered 2012-01-16: 10000 [IU] via SUBCUTANEOUS
  Filled 2012-01-16: qty 1

## 2012-01-30 ENCOUNTER — Encounter (HOSPITAL_COMMUNITY)
Admission: RE | Admit: 2012-01-30 | Discharge: 2012-01-30 | Disposition: A | Discharge status: Home / Self Care | Payer: Medicare Other | Source: Ambulatory Visit | Attending: Nephrology | Admitting: Nephrology

## 2012-01-30 LAB — POCT HEMOGLOBIN-HEMACUE: Hemoglobin: 11.1 g/dL — ABNORMAL LOW (ref 13.0–17.0)

## 2012-01-30 MED ORDER — EPOETIN ALFA 10000 UNIT/ML IJ SOLN
10000.0000 [IU] | INTRAMUSCULAR | Status: DC
  Administered 2012-01-30: 10000 [IU] via SUBCUTANEOUS
  Filled 2012-01-30: qty 1

## 2012-02-12 ENCOUNTER — Other Ambulatory Visit (HOSPITAL_COMMUNITY): Payer: Self-pay | Admitting: *Deleted

## 2012-02-13 ENCOUNTER — Encounter (HOSPITAL_COMMUNITY)
Admission: RE | Admit: 2012-02-13 | Discharge: 2012-02-13 | Disposition: A | Discharge status: Home / Self Care | Payer: Medicare Other | Source: Ambulatory Visit | Attending: Nephrology | Admitting: Nephrology

## 2012-02-13 DIAGNOSIS — D638 Anemia in other chronic diseases classified elsewhere: Secondary | ICD-10-CM | POA: Insufficient documentation

## 2012-02-13 DIAGNOSIS — N184 Chronic kidney disease, stage 4 (severe): Secondary | ICD-10-CM | POA: Insufficient documentation

## 2012-02-13 LAB — COMPREHENSIVE METABOLIC PANEL
ALT: 13 U/L (ref 0–53)
AST: 15 U/L (ref 0–37)
Albumin: 3.6 g/dL (ref 3.5–5.2)
Alkaline Phosphatase: 59 U/L (ref 39–117)
Calcium: 9.1 mg/dL (ref 8.4–10.5)
GFR calc Af Amer: 19 mL/min — ABNORMAL LOW (ref >90)
Glucose, Bld: 172 mg/dL — ABNORMAL HIGH (ref 70–99)
Potassium: 3.7 mEq/L (ref 3.5–5.1)
Sodium: 142 mEq/L (ref 135–145)
Total Protein: 6.5 g/dL (ref 6.0–8.3)

## 2012-02-13 LAB — POCT HEMOGLOBIN-HEMACUE: Hemoglobin: 11.6 g/dL — ABNORMAL LOW (ref 13.0–17.0)

## 2012-02-13 MED ORDER — EPOETIN ALFA 10000 UNIT/ML IJ SOLN
10000.0000 [IU] | INTRAMUSCULAR | Status: DC
  Administered 2012-02-13: 10000 [IU] via SUBCUTANEOUS

## 2012-02-13 MED ORDER — EPOETIN ALFA 10000 UNIT/ML IJ SOLN
INTRAMUSCULAR | Status: AC
  Filled 2012-02-13: qty 1

## 2012-02-14 LAB — VITAMIN D 25 HYDROXY (VIT D DEFICIENCY, FRACTURES): Vit D, 25-Hydroxy: 68 ng/mL (ref 30–89)

## 2012-02-27 ENCOUNTER — Encounter (HOSPITAL_COMMUNITY)
Admission: RE | Admit: 2012-02-27 | Discharge: 2012-02-27 | Disposition: A | Discharge status: Home / Self Care | Payer: Medicare Other | Source: Ambulatory Visit | Attending: Nephrology | Admitting: Nephrology

## 2012-02-27 LAB — POCT HEMOGLOBIN-HEMACUE: Hemoglobin: 11.2 g/dL — ABNORMAL LOW (ref 13.0–17.0)

## 2012-02-27 MED ORDER — EPOETIN ALFA 10000 UNIT/ML IJ SOLN
10000.0000 [IU] | INTRAMUSCULAR | Status: DC
  Administered 2012-02-27: 10000 [IU] via SUBCUTANEOUS

## 2012-02-27 MED ORDER — EPOETIN ALFA 10000 UNIT/ML IJ SOLN
INTRAMUSCULAR | Status: AC
  Administered 2012-02-27: 10000 [IU] via SUBCUTANEOUS
  Filled 2012-02-27: qty 1

## 2012-03-10 ENCOUNTER — Telehealth: Payer: Self-pay | Admitting: Family Medicine

## 2012-03-10 NOTE — Telephone Encounter (Signed)
Called patient to inquire about his welfare.  His wife died in early 2023-04-02 after becoming acutely ill with sepsis and being hospitalized in [facility].  Mr. Sada reports that he is holding up well, although is concerned about strife with Beatrice's children since her death (daughter requesting her medical records [facility]; he reports family making comments that he did not take good care of her; children trying to withdraw $10,000 from joint bank account he held with his wife).  I asked that he make a follow up appointment with me in the coming month for further assessment. Paula Compton, MD

## 2012-03-12 ENCOUNTER — Encounter (HOSPITAL_COMMUNITY)
Admission: RE | Admit: 2012-03-12 | Discharge: 2012-03-12 | Disposition: A | Discharge status: Home / Self Care | Payer: Medicare Other | Source: Ambulatory Visit | Attending: Nephrology | Admitting: Nephrology

## 2012-03-12 DIAGNOSIS — N184 Chronic kidney disease, stage 4 (severe): Secondary | ICD-10-CM | POA: Insufficient documentation

## 2012-03-12 DIAGNOSIS — D638 Anemia in other chronic diseases classified elsewhere: Secondary | ICD-10-CM | POA: Insufficient documentation

## 2012-03-12 LAB — IRON AND TIBC
Iron: 46 ug/dL (ref 42–135)
Saturation Ratios: 21 % (ref 20–55)
TIBC: 218 ug/dL (ref 215–435)

## 2012-03-12 MED ORDER — EPOETIN ALFA 10000 UNIT/ML IJ SOLN
10000.0000 [IU] | INTRAMUSCULAR | Status: DC
  Administered 2012-03-12: 10000 [IU] via SUBCUTANEOUS

## 2012-03-12 MED ORDER — EPOETIN ALFA 10000 UNIT/ML IJ SOLN
INTRAMUSCULAR | Status: AC
  Administered 2012-03-12: 10000 [IU] via SUBCUTANEOUS
  Filled 2012-03-12: qty 1

## 2012-03-26 ENCOUNTER — Encounter (HOSPITAL_COMMUNITY)
Admission: RE | Admit: 2012-03-26 | Discharge: 2012-03-26 | Disposition: A | Discharge status: Home / Self Care | Payer: Medicare Other | Source: Ambulatory Visit | Attending: Nephrology | Admitting: Nephrology

## 2012-03-26 LAB — POCT HEMOGLOBIN-HEMACUE: Hemoglobin: 11.6 g/dL — ABNORMAL LOW (ref 13.0–17.0)

## 2012-03-26 MED ORDER — EPOETIN ALFA 10000 UNIT/ML IJ SOLN
10000.0000 [IU] | INTRAMUSCULAR | Status: DC
  Administered 2012-03-26: 10000 [IU] via SUBCUTANEOUS

## 2012-03-26 MED ORDER — EPOETIN ALFA 10000 UNIT/ML IJ SOLN
INTRAMUSCULAR | Status: AC
  Administered 2012-03-26: 10000 [IU] via SUBCUTANEOUS
  Filled 2012-03-26: qty 1

## 2012-04-08 ENCOUNTER — Ambulatory Visit (INDEPENDENT_AMBULATORY_CARE_PROVIDER_SITE_OTHER): Payer: Medicare Other | Admitting: Family Medicine

## 2012-04-08 ENCOUNTER — Encounter: Payer: Self-pay | Admitting: Family Medicine

## 2012-04-08 VITALS — BP 162/74 | HR 50 | Ht 70.0 in | Wt 164.0 lb

## 2012-04-08 DIAGNOSIS — F4321 Adjustment disorder with depressed mood: Secondary | ICD-10-CM | POA: Insufficient documentation

## 2012-04-08 MED ORDER — SERTRALINE HCL 25 MG PO TABS: 25.0000 mg | ORAL_TABLET | Freq: Every day | ORAL | Status: DC

## 2012-04-08 NOTE — Patient Instructions (Signed)
It was good to see you today.    I sent a prescription for a new medicine, Sertraline (Zoloft) 25mg  one time daily in the morning.  I believe this will help you with the feelings of grief.  It is a medicine we can stop in the future.  It does not cause addiction.  I would like to see you in the coming 2 to 4 weeks, or sooner if needed.

## 2012-04-09 ENCOUNTER — Encounter (HOSPITAL_COMMUNITY)
Admission: RE | Admit: 2012-04-09 | Discharge: 2012-04-09 | Disposition: A | Discharge status: Home / Self Care | Payer: Medicare Other | Source: Ambulatory Visit | Attending: Nephrology | Admitting: Nephrology

## 2012-04-09 DIAGNOSIS — D638 Anemia in other chronic diseases classified elsewhere: Secondary | ICD-10-CM | POA: Insufficient documentation

## 2012-04-09 DIAGNOSIS — N184 Chronic kidney disease, stage 4 (severe): Secondary | ICD-10-CM | POA: Insufficient documentation

## 2012-04-09 LAB — IRON AND TIBC
Iron: 65 ug/dL (ref 42–135)
UIBC: 212 ug/dL (ref 125–400)

## 2012-04-09 MED ORDER — EPOETIN ALFA 10000 UNIT/ML IJ SOLN
INTRAMUSCULAR | Status: AC
  Filled 2012-04-09: qty 1

## 2012-04-09 MED ORDER — EPOETIN ALFA 10000 UNIT/ML IJ SOLN
10000.0000 [IU] | INTRAMUSCULAR | Status: DC
  Administered 2012-04-09: 10000 [IU] via SUBCUTANEOUS

## 2012-04-09 NOTE — Progress Notes (Signed)
  Subjective:    Patient ID: MADOX CORKINS, male    DOB: [birth year], 78 y.o.   MRN: [id number]  HPI Patient seen here in follow up.  It has been 2 months since his wife Bea died of urosepsis.  He relates that he has had difficulty dealing with Bea's children, who have tried to withdraw money from joint bank account without authorization.  He continues to cry often, some loss of interest in things.  States his son is the biggest source of support for him at this time.  Stresses the importance of his faith, "I know Bea is with the [facility]une Pollack and is not sick anymore.  She's better off than we are."  Denies SI.  Denies prior history of depression, except around the time of his own mother's death in 69 at [facility].  That grief reaction was fairly short-lived, and not associated with prolonged depressive symptoms.   Review of Systems See above    Objective:   Physical Exam Awake; emotionally labile, crying while talking about his wife's death (and while relating the story of his mother's death in 9).   Does not appear to be reacting to extrasensory stimuli.       Assessment & Plan:

## 2012-04-09 NOTE — Assessment & Plan Note (Signed)
Appears to be suffering from grief reaction at 2 months after wife's death.  States that the hospice service contacted him about counseling, which he declined.  He does admit that he would be interested in medication if it will help control his emotional lability and possibly help with sleep.  To try low-dose SSRI and see back in 2 to 4 weeks or sooner as needed.

## 2012-04-23 ENCOUNTER — Encounter (HOSPITAL_COMMUNITY)
Admission: RE | Admit: 2012-04-23 | Discharge: 2012-04-23 | Disposition: A | Discharge status: Home / Self Care | Payer: Medicare Other | Source: Ambulatory Visit | Attending: Nephrology | Admitting: Nephrology

## 2012-04-23 MED ORDER — EPOETIN ALFA 10000 UNIT/ML IJ SOLN: 10000.0000 [IU] | INTRAMUSCULAR | Status: DC

## 2012-05-02 ENCOUNTER — Encounter: Payer: Self-pay | Admitting: Home Health Services

## 2012-05-02 ENCOUNTER — Ambulatory Visit (INDEPENDENT_AMBULATORY_CARE_PROVIDER_SITE_OTHER): Payer: Medicare Other | Admitting: Family Medicine

## 2012-05-02 ENCOUNTER — Encounter: Payer: Self-pay | Admitting: Family Medicine

## 2012-05-02 VITALS — BP 153/71 | HR 57 | Ht 70.0 in | Wt 166.9 lb

## 2012-05-02 DIAGNOSIS — L989 Disorder of the skin and subcutaneous tissue, unspecified: Secondary | ICD-10-CM

## 2012-05-02 DIAGNOSIS — F4320 Adjustment disorder, unspecified: Secondary | ICD-10-CM

## 2012-05-02 DIAGNOSIS — F4321 Adjustment disorder with depressed mood: Secondary | ICD-10-CM

## 2012-05-02 NOTE — Progress Notes (Signed)
  Subjective:    Patient ID: Scott Holmes, male    DOB: 1932/08/15, 78 y.o.   MRN: [id number]  HPI Here today for follow up.  Reports that he has been feeling much better since starting the sertraline 25mg , takes one time daily.  Is not tearful anymore.  Keeps busy by doing yardwork, laundry, household chores.  Wife's memorial stone came yesterday.   Reports good sleep, goes to bed 9-10pm, wakes at 6am.  Gets up a couple of times a night to void.  Goes back to sleep easily.   Son comes by one time weekly on Sundays; feels he has good support.  No further contact from Beatrice's children.    Review of Systems See above. No thoughts of self harm.     Objective:   Physical Exam Well appearing, no apparent distress. Smiling, even laughing with me in room.        Assessment & Plan:

## 2012-05-02 NOTE — Patient Instructions (Addendum)
It was great to see you today.  I am glad your are feeling better.   I would like to see you back in another 2 months or sooner if needed.

## 2012-05-02 NOTE — Assessment & Plan Note (Signed)
Seems much improved since last visit; has been taking Zoloft for the past 1 month.  No need to increase dose, he has been resistant to the idea of therapy (hospice offered to him already, he declined). Continue with same treatment.

## 2012-05-07 ENCOUNTER — Encounter (HOSPITAL_COMMUNITY)
Admission: RE | Admit: 2012-05-07 | Discharge: 2012-05-07 | Disposition: A | Discharge status: Home / Self Care | Payer: Medicare Other | Source: Ambulatory Visit | Attending: Nephrology | Admitting: Nephrology

## 2012-05-07 DIAGNOSIS — N184 Chronic kidney disease, stage 4 (severe): Secondary | ICD-10-CM | POA: Insufficient documentation

## 2012-05-07 DIAGNOSIS — D638 Anemia in other chronic diseases classified elsewhere: Secondary | ICD-10-CM | POA: Insufficient documentation

## 2012-05-07 LAB — IRON AND TIBC
Iron: 80 ug/dL (ref 42–135)
Saturation Ratios: 34 % (ref 20–55)
UIBC: 156 ug/dL (ref 125–400)

## 2012-05-07 LAB — URIC ACID: Uric Acid, Serum: 5.7 mg/dL (ref 4.0–7.8)

## 2012-05-07 LAB — PHOSPHORUS: Phosphorus: 3.7 mg/dL (ref 2.3–4.6)

## 2012-05-07 LAB — COMPREHENSIVE METABOLIC PANEL
AST: 18 U/L (ref 0–37)
Albumin: 3.6 g/dL (ref 3.5–5.2)
BUN: 46 mg/dL — ABNORMAL HIGH (ref 6–23)
CO2: 22 mEq/L (ref 19–32)
Calcium: 8.6 mg/dL (ref 8.4–10.5)
Creatinine, Ser: 3.31 mg/dL — ABNORMAL HIGH (ref 0.50–1.35)
GFR calc non Af Amer: 17 mL/min — ABNORMAL LOW (ref >90)

## 2012-05-07 LAB — POCT HEMOGLOBIN-HEMACUE: Hemoglobin: 10.4 g/dL — ABNORMAL LOW (ref 13.0–17.0)

## 2012-05-07 MED ORDER — EPOETIN ALFA 10000 UNIT/ML IJ SOLN
10000.0000 [IU] | INTRAMUSCULAR | Status: DC
  Administered 2012-05-07: 10000 [IU] via SUBCUTANEOUS
  Filled 2012-05-07: qty 1

## 2012-05-09 LAB — PTH, INTACT AND CALCIUM: PTH: 104.3 pg/mL — ABNORMAL HIGH (ref 14.0–72.0)

## 2012-05-19 ENCOUNTER — Other Ambulatory Visit: Payer: Self-pay | Admitting: Family Medicine

## 2012-05-21 ENCOUNTER — Encounter (HOSPITAL_COMMUNITY)
Admission: RE | Admit: 2012-05-21 | Discharge: 2012-05-21 | Disposition: A | Discharge status: Home / Self Care | Payer: Medicare Other | Source: Ambulatory Visit | Attending: Nephrology | Admitting: Nephrology

## 2012-05-21 MED ORDER — EPOETIN ALFA 10000 UNIT/ML IJ SOLN
INTRAMUSCULAR | Status: AC
  Filled 2012-05-21: qty 1

## 2012-05-21 MED ORDER — EPOETIN ALFA 10000 UNIT/ML IJ SOLN
10000.0000 [IU] | INTRAMUSCULAR | Status: DC
  Administered 2012-05-21: 10000 [IU] via SUBCUTANEOUS

## 2012-06-04 ENCOUNTER — Encounter (HOSPITAL_COMMUNITY)
Admission: RE | Admit: 2012-06-04 | Discharge: 2012-06-04 | Disposition: A | Discharge status: Home / Self Care | Payer: Medicare Other | Source: Ambulatory Visit | Attending: Nephrology | Admitting: Nephrology

## 2012-06-04 LAB — FERRITIN: Ferritin: 94 ng/mL (ref 22–322)

## 2012-06-04 LAB — IRON AND TIBC
Saturation Ratios: 21 % (ref 20–55)
UIBC: 206 ug/dL (ref 125–400)

## 2012-06-04 LAB — POCT HEMOGLOBIN-HEMACUE: Hemoglobin: 9.7 g/dL — ABNORMAL LOW (ref 13.0–17.0)

## 2012-06-04 MED ORDER — EPOETIN ALFA 10000 UNIT/ML IJ SOLN
10000.0000 [IU] | INTRAMUSCULAR | Status: DC
  Administered 2012-06-04: 10000 [IU] via SUBCUTANEOUS
  Filled 2012-06-04: qty 1

## 2012-06-17 ENCOUNTER — Other Ambulatory Visit (HOSPITAL_COMMUNITY): Payer: Self-pay | Admitting: *Deleted

## 2012-06-19 ENCOUNTER — Encounter (HOSPITAL_COMMUNITY)
Admission: RE | Admit: 2012-06-19 | Discharge: 2012-06-19 | Disposition: A | Discharge status: Home / Self Care | Payer: Medicare Other | Source: Ambulatory Visit | Attending: Nephrology | Admitting: Nephrology

## 2012-06-19 DIAGNOSIS — D638 Anemia in other chronic diseases classified elsewhere: Secondary | ICD-10-CM | POA: Insufficient documentation

## 2012-06-19 DIAGNOSIS — N184 Chronic kidney disease, stage 4 (severe): Secondary | ICD-10-CM | POA: Insufficient documentation

## 2012-06-19 LAB — POCT HEMOGLOBIN-HEMACUE: Hemoglobin: 9.7 g/dL — ABNORMAL LOW (ref 13.0–17.0)

## 2012-06-19 MED ORDER — EPOETIN ALFA 10000 UNIT/ML IJ SOLN
INTRAMUSCULAR | Status: AC
  Filled 2012-06-19: qty 1

## 2012-06-19 MED ORDER — EPOETIN ALFA 2000 UNIT/ML IJ SOLN
INTRAMUSCULAR | Status: AC
  Administered 2012-06-19: 12000 [IU] via SUBCUTANEOUS
  Filled 2012-06-19: qty 1

## 2012-06-19 MED ORDER — EPOETIN ALFA 10000 UNIT/ML IJ SOLN: 12000.0000 [IU] | INTRAMUSCULAR | Status: DC

## 2012-06-25 ENCOUNTER — Encounter (HOSPITAL_COMMUNITY): Payer: Medicare Other

## 2012-07-02 ENCOUNTER — Encounter: Payer: Self-pay | Admitting: Home Health Services

## 2012-07-02 ENCOUNTER — Encounter (HOSPITAL_COMMUNITY)
Admission: RE | Admit: 2012-07-02 | Discharge: 2012-07-02 | Disposition: A | Discharge status: Home / Self Care | Payer: Medicare Other | Source: Ambulatory Visit | Attending: Nephrology | Admitting: Nephrology

## 2012-07-02 ENCOUNTER — Ambulatory Visit (INDEPENDENT_AMBULATORY_CARE_PROVIDER_SITE_OTHER): Payer: Medicare Other | Admitting: Home Health Services

## 2012-07-02 VITALS — BP 149/68 | HR 50 | Temp 96.8°F | Ht 70.0 in | Wt 170.0 lb

## 2012-07-02 DIAGNOSIS — Z0000 Encounter for general adult medical examination without abnormal findings: Secondary | ICD-10-CM

## 2012-07-02 LAB — IRON AND TIBC
Iron: 84 ug/dL (ref 42–135)
Saturation Ratios: 29 % (ref 20–55)
UIBC: 203 ug/dL (ref 125–400)

## 2012-07-02 MED ORDER — EPOETIN ALFA 10000 UNIT/ML IJ SOLN: 12000.0000 [IU] | INTRAMUSCULAR | Status: DC

## 2012-07-02 MED ORDER — EPOETIN ALFA 20000 UNIT/ML IJ SOLN
INTRAMUSCULAR | Status: AC
  Administered 2012-07-02: 12000 [IU] via SUBCUTANEOUS
  Filled 2012-07-02: qty 1

## 2012-07-02 NOTE — Patient Instructions (Signed)
1. Continue to walk your dog and work in the yard for exercise. 2. Continue to monitor your weight. 3. If you feel sad or down for longer than 2 weeks, schedule an appointment with Dr. Mauricio Po.

## 2012-07-02 NOTE — Progress Notes (Signed)
Patient here for annual wellness visit, patient reports: Risk Factors/Conditions needing evaluation or treatment: PT does not have any new risk factors that need evaluation. Home Safety: Pt lives by self in 1 story home.  Pt reports having smoke detectors. Other Information: Corrective lens: Pt wears corrective lens for driving.  Visits eye dr every 2 years. Dentures: Pt has full dentures.  Memory: Pt denies memory problems. Patient's Mini Mental Score (recorded in doc. flowsheet): 30  Balance/Gait:  Balance Abnormal Patient value  Sitting balance    Sit to stand    Attempts to arise    Immediate standing balance    Standing balance    Nudge    Eyes closed- Romberg    Tandem stance    Back lean    Neck Rotation    360 degree turn    Sitting down     Gait Abnormal Patient value  Initiation of gait    Step length-left    Step length-right    Step height-left    Step height-right    Step symmetry x   Step continuity    Path deviation    Trunk movement    Walking stance x Leans to left in shoulders      Annual Wellness Visit Requirements Recorded Today In  Medical, family, social history Past Medical, Family, Social History Section  Current providers Care team  Current medications Medications  Wt, BP, Ht, BMI Vital signs  Tobacco, alcohol, illicit drug use History  ADL Nurse Assessment  Depression Screening Nurse Assessment  Cognitive impairment Nurse Assessment  Mini Mental Status Document Flowsheet  Fall Risk Nurse Assessment  Home Safety Progress Note  End of Life Planning (welcome visit) Social Documentation  Medicare preventative services Progress Note  Risk factors/conditions needing evaluation/treatment Progress Note  Personalized health advice Patient Instructions, goals, letter  Diet & Exercise Social Documentation  Emergency Contact Social Documentation  Seat Belts Social Documentation  Sun exposure/protection Social Documentation   Medicare  Prevention Plan:   Recommended Medicare Prevention Screenings Men over 65 Test For Frequency Date of Last- BOLD if needed  Colorectal Cancer 1-10 yrs NI- due to age  Prostate Cancer Never or yearly NI  Aortic Aneurysm Once if 65-75 with hx of smoking NI due to age  Cholesterol 5 yrs Pt reported done  Diabetes yearly 8/13  HIV yearly declined  Influenza Shot yearly 9/12  Pneumonia Shot once Pt reported done  Zostavax Shot once Pt not interested     I have reviewed this visit and discussed with Scott Holmes and agree with her documentation.

## 2012-07-03 MED FILL — Epoetin Alfa Inj 20000 Unit/ML: INTRAMUSCULAR | Qty: 1 | Status: AC

## 2012-07-04 ENCOUNTER — Ambulatory Visit (INDEPENDENT_AMBULATORY_CARE_PROVIDER_SITE_OTHER): Payer: Medicare Other | Admitting: Family Medicine

## 2012-07-04 ENCOUNTER — Encounter: Payer: Self-pay | Admitting: Family Medicine

## 2012-07-04 VITALS — BP 133/65 | HR 50 | Ht 70.0 in | Wt 168.5 lb

## 2012-07-04 DIAGNOSIS — R040 Epistaxis: Secondary | ICD-10-CM

## 2012-07-04 DIAGNOSIS — L989 Disorder of the skin and subcutaneous tissue, unspecified: Secondary | ICD-10-CM

## 2012-07-04 DIAGNOSIS — F4321 Adjustment disorder with depressed mood: Secondary | ICD-10-CM

## 2012-07-04 MED ORDER — FUROSEMIDE 80 MG PO TABS: 120.0000 mg | ORAL_TABLET | Freq: Every day | ORAL | Status: DC

## 2012-07-04 MED ORDER — MUPIROCIN CALCIUM 2 % EX CREA: TOPICAL_CREAM | CUTANEOUS | Status: DC

## 2012-07-04 NOTE — Patient Instructions (Addendum)
It is good to see you today. I am glad you are feeling better.   I sent a prescription for a cream called mupirocin, to be applied in the left side of your nose in the morning and evening.  Use a humidifier in the bedroom at night, to help reduce the frequency of the blood from nose.  Please be in touch if you continue to have bleeding.   I would like to see you back in about 3 months (November), or sooner if needed.

## 2012-07-04 NOTE — Assessment & Plan Note (Signed)
Anterior L naris.  To treat with mupirocin as lubricant and humidification of bedroom at night.  If continues or worsens, may consider ENT for cautery.

## 2012-07-04 NOTE — Assessment & Plan Note (Signed)
Resolved lesion.  No further intervention unless recurrence.

## 2012-07-04 NOTE — Progress Notes (Signed)
  Subjective:    Patient ID: Scott Holmes, male    DOB: 07/09/1932, 78 y.o.   MRN: [id number]  HPI Scott Holmes is seen today for follow up of depression.  He is feeling well; remaining active.  Notes that this past Tuesday (8/27) was the 25th wedding anniversary; wife Scott Holmes is deceased.  Continues with Zoloft 25mg  daily and feels this is helpful.  Has support of his son, who visits every Sunday.   Reports some mild bleeding in tissue when he blows nose, always from the L naris.  Has been ongoing for the past 1 month or so.  No ongoing epistaxis, no hemoptysis.  Has had history of smoking (40 pack years) but quit in 1995.    Sees Scott Holmes from Nephrology for his CKD>  His Lasix was recently increased from 80 to 120mg  daily.     Review of Systems See above. Also, no hoarseness or pharyngitis, no cough or sputum production.     Objective:   Physical Exam Well appearing, affect not blunted.  No apparent distress HEENT Neck supple. No cervical adenopathy. TMs clear. No maxillary or frontal sinus tenderness. Nasal turbinates clear bilat.  There is a patch of dried blood in anterior septum of L naris. No active bleeding.  Clear oropharynx.  SKIN: Skin over L preauricular area flat and clear with mild hypopigmentation at site of prior biopsy.       Assessment & Plan:

## 2012-07-04 NOTE — Assessment & Plan Note (Signed)
Appears to be doing much better with respect to this diagnosis.  We discussed the duration of therapy with the sertraline; he is fine with leaving it the way it is.  We may revisit the topic of discontinuation at some point in the future.

## 2012-07-15 ENCOUNTER — Other Ambulatory Visit (HOSPITAL_COMMUNITY): Payer: Self-pay | Admitting: *Deleted

## 2012-07-16 ENCOUNTER — Encounter (HOSPITAL_COMMUNITY)
Admission: RE | Admit: 2012-07-16 | Discharge: 2012-07-16 | Disposition: A | Discharge status: Home / Self Care | Payer: Medicare Other | Source: Ambulatory Visit | Attending: Nephrology | Admitting: Nephrology

## 2012-07-16 DIAGNOSIS — D638 Anemia in other chronic diseases classified elsewhere: Secondary | ICD-10-CM | POA: Insufficient documentation

## 2012-07-16 DIAGNOSIS — N184 Chronic kidney disease, stage 4 (severe): Secondary | ICD-10-CM | POA: Insufficient documentation

## 2012-07-16 MED ORDER — EPOETIN ALFA 10000 UNIT/ML IJ SOLN
12000.0000 [IU] | INTRAMUSCULAR | Status: DC
  Administered 2012-07-16: 12000 [IU] via SUBCUTANEOUS
  Filled 2012-07-16: qty 2

## 2012-07-17 LAB — VITAMIN D 25 HYDROXY (VIT D DEFICIENCY, FRACTURES): Vit D, 25-Hydroxy: 45 ng/mL (ref 30–89)

## 2012-07-30 ENCOUNTER — Encounter (HOSPITAL_COMMUNITY)
Admission: RE | Admit: 2012-07-30 | Discharge: 2012-07-30 | Disposition: A | Discharge status: Home / Self Care | Payer: Medicare Other | Source: Ambulatory Visit | Attending: Nephrology | Admitting: Nephrology

## 2012-07-30 LAB — IRON AND TIBC
Iron: 100 ug/dL (ref 42–135)
Saturation Ratios: 34 % (ref 20–55)
TIBC: 295 ug/dL (ref 215–435)

## 2012-07-30 LAB — POCT HEMOGLOBIN-HEMACUE: Hemoglobin: 10.1 g/dL — ABNORMAL LOW (ref 13.0–17.0)

## 2012-07-30 MED ORDER — EPOETIN ALFA 10000 UNIT/ML IJ SOLN
INTRAMUSCULAR | Status: AC
  Filled 2012-07-30: qty 1

## 2012-07-30 MED ORDER — EPOETIN ALFA 2000 UNIT/ML IJ SOLN
INTRAMUSCULAR | Status: AC
  Administered 2012-07-30: 2000 [IU] via SUBCUTANEOUS
  Filled 2012-07-30: qty 1

## 2012-07-30 MED ORDER — EPOETIN ALFA 10000 UNIT/ML IJ SOLN
12000.0000 [IU] | INTRAMUSCULAR | Status: DC
  Administered 2012-07-30: 10000 [IU] via SUBCUTANEOUS

## 2012-08-12 ENCOUNTER — Encounter (HOSPITAL_COMMUNITY): Payer: Medicare Other

## 2012-08-13 ENCOUNTER — Encounter (HOSPITAL_COMMUNITY)
Admission: RE | Admit: 2012-08-13 | Discharge: 2012-08-13 | Disposition: A | Discharge status: Home / Self Care | Payer: Medicare Other | Source: Ambulatory Visit | Attending: Nephrology | Admitting: Nephrology

## 2012-08-13 DIAGNOSIS — N184 Chronic kidney disease, stage 4 (severe): Secondary | ICD-10-CM | POA: Insufficient documentation

## 2012-08-13 DIAGNOSIS — D638 Anemia in other chronic diseases classified elsewhere: Secondary | ICD-10-CM | POA: Insufficient documentation

## 2012-08-13 LAB — COMPREHENSIVE METABOLIC PANEL
ALT: 17 U/L (ref 0–53)
AST: 21 U/L (ref 0–37)
Albumin: 4 g/dL (ref 3.5–5.2)
Alkaline Phosphatase: 63 U/L (ref 39–117)
BUN: 61 mg/dL — ABNORMAL HIGH (ref 6–23)
CO2: 23 mEq/L (ref 19–32)
Calcium: 8.8 mg/dL (ref 8.4–10.5)
Chloride: 103 mEq/L (ref 96–112)
Creatinine, Ser: 4.13 mg/dL — ABNORMAL HIGH (ref 0.50–1.35)
GFR calc Af Amer: 15 mL/min — ABNORMAL LOW (ref >90)
GFR calc non Af Amer: 13 mL/min — ABNORMAL LOW (ref >90)
Glucose, Bld: 109 mg/dL — ABNORMAL HIGH (ref 70–99)
Potassium: 3.6 mEq/L (ref 3.5–5.1)
Sodium: 142 mEq/L (ref 135–145)
Total Bilirubin: 0.2 mg/dL — ABNORMAL LOW (ref 0.3–1.2)
Total Protein: 7.2 g/dL (ref 6.0–8.3)

## 2012-08-13 LAB — URIC ACID: Uric Acid, Serum: 6.9 mg/dL (ref 4.0–7.8)

## 2012-08-13 LAB — PHOSPHORUS: Phosphorus: 5.3 mg/dL — ABNORMAL HIGH (ref 2.3–4.6)

## 2012-08-13 LAB — POCT HEMOGLOBIN-HEMACUE: Hemoglobin: 10.9 g/dL — ABNORMAL LOW (ref 13.0–17.0)

## 2012-08-13 MED ORDER — EPOETIN ALFA 10000 UNIT/ML IJ SOLN
INTRAMUSCULAR | Status: AC
  Filled 2012-08-13: qty 1

## 2012-08-13 MED ORDER — EPOETIN ALFA 10000 UNIT/ML IJ SOLN
12000.0000 [IU] | INTRAMUSCULAR | Status: DC
  Administered 2012-08-13: 12000 [IU] via SUBCUTANEOUS

## 2012-08-18 ENCOUNTER — Other Ambulatory Visit: Payer: Self-pay | Admitting: Family Medicine

## 2012-08-27 ENCOUNTER — Ambulatory Visit (INDEPENDENT_AMBULATORY_CARE_PROVIDER_SITE_OTHER): Payer: Medicare Other | Admitting: *Deleted

## 2012-08-27 ENCOUNTER — Encounter (HOSPITAL_COMMUNITY)
Admission: RE | Admit: 2012-08-27 | Discharge: 2012-08-27 | Disposition: A | Discharge status: Home / Self Care | Payer: Medicare Other | Source: Ambulatory Visit | Attending: Nephrology | Admitting: Nephrology

## 2012-08-27 DIAGNOSIS — Z23 Encounter for immunization: Secondary | ICD-10-CM

## 2012-08-27 LAB — POCT HEMOGLOBIN-HEMACUE: Hemoglobin: 11.6 g/dL — ABNORMAL LOW (ref 13.0–17.0)

## 2012-08-27 LAB — IRON AND TIBC
Saturation Ratios: 34 % (ref 20–55)
TIBC: 286 ug/dL (ref 215–435)
UIBC: 189 ug/dL (ref 125–400)

## 2012-08-27 LAB — FERRITIN: Ferritin: 36 ng/mL (ref 22–322)

## 2012-08-27 MED ORDER — EPOETIN ALFA 10000 UNIT/ML IJ SOLN
INTRAMUSCULAR | Status: AC
  Filled 2012-08-27: qty 1

## 2012-08-27 MED ORDER — EPOETIN ALFA 10000 UNIT/ML IJ SOLN
12000.0000 [IU] | INTRAMUSCULAR | Status: DC
  Administered 2012-08-27: 12000 [IU] via SUBCUTANEOUS

## 2012-09-05 ENCOUNTER — Other Ambulatory Visit: Payer: Self-pay | Admitting: Family Medicine

## 2012-09-09 ENCOUNTER — Other Ambulatory Visit (HOSPITAL_COMMUNITY): Payer: Self-pay | Admitting: *Deleted

## 2012-09-10 ENCOUNTER — Encounter (HOSPITAL_COMMUNITY)
Admission: RE | Admit: 2012-09-10 | Discharge: 2012-09-10 | Disposition: A | Discharge status: Home / Self Care | Payer: Medicare Other | Source: Ambulatory Visit | Attending: Nephrology | Admitting: Nephrology

## 2012-09-10 DIAGNOSIS — D638 Anemia in other chronic diseases classified elsewhere: Secondary | ICD-10-CM | POA: Insufficient documentation

## 2012-09-10 DIAGNOSIS — N184 Chronic kidney disease, stage 4 (severe): Secondary | ICD-10-CM | POA: Insufficient documentation

## 2012-09-10 MED ORDER — EPOETIN ALFA 10000 UNIT/ML IJ SOLN: 12000.0000 [IU] | INTRAMUSCULAR | Status: DC

## 2012-09-24 ENCOUNTER — Encounter (HOSPITAL_COMMUNITY)
Admission: RE | Admit: 2012-09-24 | Discharge: 2012-09-24 | Disposition: A | Discharge status: Home / Self Care | Payer: Medicare Other | Source: Ambulatory Visit | Attending: Nephrology | Admitting: Nephrology

## 2012-09-24 LAB — FERRITIN: Ferritin: 45 ng/mL (ref 22–322)

## 2012-09-24 LAB — POCT HEMOGLOBIN-HEMACUE: Hemoglobin: 11.8 g/dL — ABNORMAL LOW (ref 13.0–17.0)

## 2012-09-24 MED ORDER — EPOETIN ALFA 10000 UNIT/ML IJ SOLN
INTRAMUSCULAR | Status: AC
  Filled 2012-09-24: qty 1

## 2012-09-24 MED ORDER — EPOETIN ALFA 10000 UNIT/ML IJ SOLN
12000.0000 [IU] | INTRAMUSCULAR | Status: DC
  Administered 2012-09-24: 10000 [IU] via SUBCUTANEOUS

## 2012-09-24 MED ORDER — EPOETIN ALFA 2000 UNIT/ML IJ SOLN
INTRAMUSCULAR | Status: AC
  Administered 2012-09-24: 2000 [IU] via SUBCUTANEOUS
  Filled 2012-09-24: qty 1

## 2012-09-26 ENCOUNTER — Encounter: Payer: Self-pay | Admitting: Family Medicine

## 2012-09-26 ENCOUNTER — Ambulatory Visit (INDEPENDENT_AMBULATORY_CARE_PROVIDER_SITE_OTHER): Payer: Medicare Other | Admitting: Family Medicine

## 2012-09-26 ENCOUNTER — Encounter: Payer: Self-pay | Admitting: Home Health Services

## 2012-09-26 VITALS — BP 150/70 | HR 53 | Ht 70.0 in | Wt 167.6 lb

## 2012-09-26 DIAGNOSIS — F4321 Adjustment disorder with depressed mood: Secondary | ICD-10-CM

## 2012-09-26 MED ORDER — SERTRALINE HCL 25 MG PO TABS: 25.0000 mg | ORAL_TABLET | Freq: Every day | ORAL | Status: DC

## 2012-09-26 NOTE — Assessment & Plan Note (Signed)
Doing much better; keeps busy.  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  Plan to discontinue Zoloft 25mg  after New Years; I asked him to call me to let me know how he is doing; follow up here in Feb 2014 or sooner as needed.

## 2012-09-26 NOTE — Patient Instructions (Addendum)
It was a pleasure to see you today.   Like we discussed, I think it is appropriate to stop the Zoloft around January 1st.  I would like to see you back in February as we discussed.

## 2012-09-26 NOTE — Progress Notes (Signed)
  Subjective:    Patient ID: Scott Holmes, male    DOB: 01-Apr-1932, 78 y.o.   MRN: [id number]  HPI Seen for follow up of depression; says he feels well on the Zoloft, but would like to discontinue after New Years and see how he does.  Speaking with the hospice chaplain "as a friend; I dont need counseling". Feels safe.  Sees his son on Sundays, plans to spend Thanksgiving at his son's house.   Nose bleeds better.    Review of Systems     Objective:   Physical Exam Well appearing, no apparent distress        Assessment & Plan:

## 2012-10-08 ENCOUNTER — Encounter (HOSPITAL_COMMUNITY)
Admission: RE | Admit: 2012-10-08 | Discharge: 2012-10-08 | Disposition: A | Discharge status: Home / Self Care | Payer: Medicare Other | Source: Ambulatory Visit | Attending: Nephrology | Admitting: Nephrology

## 2012-10-08 DIAGNOSIS — D638 Anemia in other chronic diseases classified elsewhere: Secondary | ICD-10-CM | POA: Insufficient documentation

## 2012-10-08 DIAGNOSIS — N184 Chronic kidney disease, stage 4 (severe): Secondary | ICD-10-CM | POA: Insufficient documentation

## 2012-10-08 MED ORDER — EPOETIN ALFA 20000 UNIT/ML IJ SOLN
INTRAMUSCULAR | Status: AC
  Administered 2012-10-08: 20000 [IU]
  Filled 2012-10-08: qty 1

## 2012-10-08 MED ORDER — EPOETIN ALFA 10000 UNIT/ML IJ SOLN
12000.0000 [IU] | INTRAMUSCULAR | Status: DC
Start: 2012-10-08 — End: 2012-10-09

## 2012-10-22 ENCOUNTER — Encounter (HOSPITAL_COMMUNITY)
Admission: RE | Admit: 2012-10-22 | Discharge: 2012-10-22 | Disposition: A | Discharge status: Home / Self Care | Payer: Medicare Other | Source: Ambulatory Visit | Attending: Nephrology | Admitting: Nephrology

## 2012-10-22 ENCOUNTER — Encounter (HOSPITAL_COMMUNITY): Payer: Medicare Other

## 2012-10-22 LAB — IRON AND TIBC: Saturation Ratios: 30 % (ref 20–55)

## 2012-10-22 LAB — POCT HEMOGLOBIN-HEMACUE: Hemoglobin: 12.4 g/dL — ABNORMAL LOW (ref 13.0–17.0)

## 2012-10-22 MED ORDER — EPOETIN ALFA 10000 UNIT/ML IJ SOLN: 12000.0000 [IU] | INTRAMUSCULAR | Status: DC

## 2012-11-06 ENCOUNTER — Encounter (HOSPITAL_COMMUNITY)
Admission: RE | Admit: 2012-11-06 | Discharge: 2012-11-06 | Disposition: A | Discharge status: Home / Self Care | Payer: Medicare Other | Source: Ambulatory Visit | Attending: Nephrology | Admitting: Nephrology

## 2012-11-06 DIAGNOSIS — D638 Anemia in other chronic diseases classified elsewhere: Secondary | ICD-10-CM | POA: Insufficient documentation

## 2012-11-06 DIAGNOSIS — N184 Chronic kidney disease, stage 4 (severe): Secondary | ICD-10-CM | POA: Insufficient documentation

## 2012-11-06 LAB — COMPREHENSIVE METABOLIC PANEL
ALT: 13 U/L (ref 0–53)
AST: 18 U/L (ref 0–37)
CO2: 23 mEq/L (ref 19–32)
Calcium: 9.1 mg/dL (ref 8.4–10.5)
GFR calc non Af Amer: 11 mL/min — ABNORMAL LOW (ref >90)
Potassium: 4 mEq/L (ref 3.5–5.1)
Sodium: 145 mEq/L (ref 135–145)
Total Protein: 7.2 g/dL (ref 6.0–8.3)

## 2012-11-06 MED ORDER — EPOETIN ALFA 10000 UNIT/ML IJ SOLN
INTRAMUSCULAR | Status: AC
  Administered 2012-11-06: 10000 [IU] via SUBCUTANEOUS
  Filled 2012-11-06: qty 1

## 2012-11-06 MED ORDER — EPOETIN ALFA 2000 UNIT/ML IJ SOLN
INTRAMUSCULAR | Status: AC
  Administered 2012-11-06: 2000 [IU] via SUBCUTANEOUS
  Filled 2012-11-06: qty 1

## 2012-11-06 MED ORDER — EPOETIN ALFA 10000 UNIT/ML IJ SOLN: 12000.0000 [IU] | INTRAMUSCULAR | Status: DC

## 2012-11-07 LAB — PTH, INTACT AND CALCIUM
Calcium, Total (PTH): 8.8 mg/dL (ref 8.4–10.5)
PTH: 141.6 pg/mL — ABNORMAL HIGH (ref 14.0–72.0)

## 2012-11-19 ENCOUNTER — Encounter (HOSPITAL_COMMUNITY)
Admission: RE | Admit: 2012-11-19 | Discharge: 2012-11-19 | Disposition: A | Discharge status: Home / Self Care | Payer: Medicare Other | Source: Ambulatory Visit | Attending: Nephrology | Admitting: Nephrology

## 2012-11-19 LAB — POCT HEMOGLOBIN-HEMACUE: Hemoglobin: 11.5 g/dL — ABNORMAL LOW (ref 13.0–17.0)

## 2012-11-19 LAB — IRON AND TIBC: TIBC: 262 ug/dL (ref 215–435)

## 2012-11-19 LAB — FERRITIN: Ferritin: 74 ng/mL (ref 22–322)

## 2012-11-19 MED ORDER — EPOETIN ALFA 10000 UNIT/ML IJ SOLN: 12000.0000 [IU] | INTRAMUSCULAR | Status: DC

## 2012-11-19 MED ORDER — EPOETIN ALFA 10000 UNIT/ML IJ SOLN
INTRAMUSCULAR | Status: AC
  Administered 2012-11-19: 10000 [IU] via SUBCUTANEOUS
  Filled 2012-11-19: qty 1

## 2012-11-19 MED ORDER — EPOETIN ALFA 2000 UNIT/ML IJ SOLN
INTRAMUSCULAR | Status: AC
  Administered 2012-11-19: 2000 [IU] via SUBCUTANEOUS
  Filled 2012-11-19: qty 1

## 2012-11-21 ENCOUNTER — Other Ambulatory Visit: Payer: Self-pay | Admitting: Family Medicine

## 2012-12-02 ENCOUNTER — Other Ambulatory Visit (HOSPITAL_COMMUNITY): Payer: Self-pay | Admitting: *Deleted

## 2012-12-03 ENCOUNTER — Encounter (HOSPITAL_COMMUNITY)
Admission: RE | Admit: 2012-12-03 | Discharge: 2012-12-03 | Disposition: A | Discharge status: Home / Self Care | Payer: Medicare Other | Source: Ambulatory Visit | Attending: Nephrology | Admitting: Nephrology

## 2012-12-03 LAB — POCT HEMOGLOBIN-HEMACUE: Hemoglobin: 11.9 g/dL — ABNORMAL LOW (ref 13.0–17.0)

## 2012-12-03 MED ORDER — EPOETIN ALFA 10000 UNIT/ML IJ SOLN: 12000.0000 [IU] | INTRAMUSCULAR | Status: DC

## 2012-12-03 MED ORDER — EPOETIN ALFA 20000 UNIT/ML IJ SOLN
INTRAMUSCULAR | Status: AC
  Administered 2012-12-03: 12000 [IU]
  Filled 2012-12-03: qty 1

## 2012-12-05 ENCOUNTER — Other Ambulatory Visit: Payer: Self-pay | Admitting: Family Medicine

## 2012-12-16 ENCOUNTER — Encounter (HOSPITAL_COMMUNITY)
Admission: RE | Admit: 2012-12-16 | Discharge: 2012-12-16 | Disposition: A | Discharge status: Home / Self Care | Payer: Medicare Other | Source: Ambulatory Visit | Attending: Nephrology | Admitting: Nephrology

## 2012-12-16 DIAGNOSIS — D638 Anemia in other chronic diseases classified elsewhere: Secondary | ICD-10-CM | POA: Insufficient documentation

## 2012-12-16 DIAGNOSIS — N184 Chronic kidney disease, stage 4 (severe): Secondary | ICD-10-CM | POA: Insufficient documentation

## 2012-12-16 LAB — IRON AND TIBC
Saturation Ratios: 35 % (ref 20–55)
TIBC: 280 ug/dL (ref 215–435)

## 2012-12-16 LAB — POCT HEMOGLOBIN-HEMACUE: Hemoglobin: 11.7 g/dL — ABNORMAL LOW (ref 13.0–17.0)

## 2012-12-16 MED ORDER — EPOETIN ALFA 10000 UNIT/ML IJ SOLN
12000.0000 [IU] | INTRAMUSCULAR | Status: DC
  Administered 2012-12-16: 10000 [IU] via SUBCUTANEOUS

## 2012-12-16 MED ORDER — EPOETIN ALFA 10000 UNIT/ML IJ SOLN
INTRAMUSCULAR | Status: AC
  Administered 2012-12-16: 10000 [IU] via SUBCUTANEOUS
  Filled 2012-12-16: qty 1

## 2012-12-16 MED ORDER — EPOETIN ALFA 2000 UNIT/ML IJ SOLN
2000.0000 [IU] | Freq: Once | INTRAMUSCULAR | Status: AC
  Administered 2012-12-16: 2000 [IU] via SUBCUTANEOUS

## 2012-12-17 ENCOUNTER — Other Ambulatory Visit: Payer: Self-pay

## 2012-12-17 ENCOUNTER — Encounter (HOSPITAL_COMMUNITY): Payer: Medicare Other

## 2012-12-30 ENCOUNTER — Other Ambulatory Visit (HOSPITAL_COMMUNITY): Payer: Self-pay | Admitting: *Deleted

## 2012-12-31 ENCOUNTER — Encounter (HOSPITAL_COMMUNITY)
Admission: RE | Admit: 2012-12-31 | Discharge: 2012-12-31 | Disposition: A | Discharge status: Home / Self Care | Payer: Medicare Other | Source: Ambulatory Visit | Attending: Nephrology | Admitting: Nephrology

## 2012-12-31 MED ORDER — EPOETIN ALFA 10000 UNIT/ML IJ SOLN: 12000.0000 [IU] | INTRAMUSCULAR | Status: DC

## 2012-12-31 MED ORDER — EPOETIN ALFA 2000 UNIT/ML IJ SOLN
INTRAMUSCULAR | Status: AC
  Administered 2012-12-31: 2000 [IU] via SUBCUTANEOUS
  Filled 2012-12-31: qty 1

## 2012-12-31 MED ORDER — EPOETIN ALFA 10000 UNIT/ML IJ SOLN
INTRAMUSCULAR | Status: AC
  Administered 2012-12-31: 10000 [IU] via SUBCUTANEOUS
  Filled 2012-12-31: qty 1

## 2013-01-14 ENCOUNTER — Encounter (HOSPITAL_COMMUNITY)
Admission: RE | Admit: 2013-01-14 | Discharge: 2013-01-14 | Disposition: A | Discharge status: Home / Self Care | Payer: Medicare Other | Source: Ambulatory Visit | Attending: Nephrology | Admitting: Nephrology

## 2013-01-14 DIAGNOSIS — N184 Chronic kidney disease, stage 4 (severe): Secondary | ICD-10-CM | POA: Insufficient documentation

## 2013-01-14 DIAGNOSIS — D638 Anemia in other chronic diseases classified elsewhere: Secondary | ICD-10-CM | POA: Insufficient documentation

## 2013-01-14 LAB — POCT HEMOGLOBIN-HEMACUE: Hemoglobin: 11.5 g/dL — ABNORMAL LOW (ref 13.0–17.0)

## 2013-01-14 LAB — IRON AND TIBC
Saturation Ratios: 25 % (ref 20–55)
TIBC: 266 ug/dL (ref 215–435)
UIBC: 199 ug/dL (ref 125–400)

## 2013-01-14 MED ORDER — EPOETIN ALFA 10000 UNIT/ML IJ SOLN
INTRAMUSCULAR | Status: AC
  Administered 2013-01-14: 10000 [IU] via SUBCUTANEOUS
  Filled 2013-01-14: qty 1

## 2013-01-14 MED ORDER — EPOETIN ALFA 10000 UNIT/ML IJ SOLN: 12000.0000 [IU] | INTRAMUSCULAR | Status: DC

## 2013-01-14 MED ORDER — EPOETIN ALFA 2000 UNIT/ML IJ SOLN
INTRAMUSCULAR | Status: AC
  Administered 2013-01-14: 2000 [IU] via SUBCUTANEOUS
  Filled 2013-01-14: qty 1

## 2013-01-28 ENCOUNTER — Encounter (HOSPITAL_COMMUNITY)
Admission: RE | Admit: 2013-01-28 | Discharge: 2013-01-28 | Disposition: A | Discharge status: Home / Self Care | Payer: Medicare Other | Source: Ambulatory Visit | Attending: Nephrology | Admitting: Nephrology

## 2013-01-28 MED ORDER — EPOETIN ALFA 10000 UNIT/ML IJ SOLN: 12000.0000 [IU] | INTRAMUSCULAR | Status: DC

## 2013-01-29 ENCOUNTER — Other Ambulatory Visit: Payer: Self-pay | Admitting: Family Medicine

## 2013-02-03 ENCOUNTER — Other Ambulatory Visit: Payer: Self-pay | Admitting: Family Medicine

## 2013-02-03 ENCOUNTER — Ambulatory Visit (INDEPENDENT_AMBULATORY_CARE_PROVIDER_SITE_OTHER): Payer: Medicare Other | Admitting: Family Medicine

## 2013-02-03 ENCOUNTER — Encounter: Payer: Self-pay | Admitting: Family Medicine

## 2013-02-03 VITALS — BP 155/65 | HR 57 | Ht 70.0 in | Wt 164.0 lb

## 2013-02-03 DIAGNOSIS — R21 Rash and other nonspecific skin eruption: Secondary | ICD-10-CM

## 2013-02-03 DIAGNOSIS — L209 Atopic dermatitis, unspecified: Secondary | ICD-10-CM

## 2013-02-03 DIAGNOSIS — L989 Disorder of the skin and subcutaneous tissue, unspecified: Secondary | ICD-10-CM

## 2013-02-03 DIAGNOSIS — L570 Actinic keratosis: Secondary | ICD-10-CM

## 2013-02-03 DIAGNOSIS — L2089 Other atopic dermatitis: Secondary | ICD-10-CM

## 2013-02-03 MED ORDER — DESLORATADINE 5 MG PO TABS: 5.0000 mg | ORAL_TABLET | Freq: Every day | ORAL | Status: DC

## 2013-02-03 NOTE — Patient Instructions (Addendum)
It was a pleasure to see you today.  I will call your home phone with the resutls of the skin scraping done today, as well as instructions about treatment.  I am sending the right cheek skin biopsy for pathology review, and I will call you when it is back.  I would like to see you back in 4 to 6 months, or sooner if needed.

## 2013-02-04 NOTE — Assessment & Plan Note (Signed)
Skin scraping done today is negative for fungal organisms.  May continue to use clobetasol, emollients.  Oral antihistamine for itch.

## 2013-02-04 NOTE — Assessment & Plan Note (Signed)
Shave biopsy of L preauricular lesion, pre-path provisional diagnosis of seb keratosis versus AK.  Will contact patient when results of biopsy are back.

## 2013-02-04 NOTE — Progress Notes (Signed)
  Subjective:    Patient ID: KEVONTAY BURKS, male    DOB: 04-14-1932, 79 y.o.   MRN: [id number]  HPI [facility] is here for follow up of generalized pruritus, with focal worsening along his scapulae bilaterally. Had used clobetasol with some improvement, however has had little relief with the itch over the scapulae.  Is concerned about possible recurrence of shingles; has had it on the L arm before and has some itch and rash on L arm now.   Lesion on R preauricular area that is dark and raised, he would like it removed.   Review of Systems No fevers or chills.   Social Hx; Today is the 1-year anniversary of his wife's death.  He was her primary caregiver.  Has stopped the sertraline, says he still sees the counselor from hospice whom he sees more "as a friend" than as a therapist/counselor. Feels he is coping well.     Objective:   Physical Exam Well appearing, no apparent distress HEENT neck supple. SKIN: patchy dry and mildly erythematous skin along dorsum of L forearm, extending proximal to elbow. Flaky.  Also, similar dry and flaky/mildly erythematous skin patches along scapulae bilaterally (crossing midline).        Assessment & Plan:  Procedure Note for shave biopsy of R preauricular skin lesion, most consistent with AK or Seb keratosis.  Discussed risks of bleeding, infection and scarring.  Patient gives verbal and written consent for shave biopsy after being given opportunity to ask questions.  Time out taken.  Correct site identified.  Area prepped in clean fashion, infiltrated with 1% lidocaine without epinephrine.  Shave biopsy performed and placed in formalin.  Good cosmetic result.  Dressed in usual fashion.

## 2013-02-05 ENCOUNTER — Telehealth: Payer: Self-pay | Admitting: Family Medicine

## 2013-02-05 DIAGNOSIS — G2581 Restless legs syndrome: Secondary | ICD-10-CM

## 2013-02-05 MED ORDER — ROPINIROLE HCL 0.25 MG PO TABS: 0.2500 mg | ORAL_TABLET | Freq: Every day | ORAL | Status: DC

## 2013-02-05 NOTE — Telephone Encounter (Signed)
Called patient to report AK and Seb Derm on his pathology of Right preauricular lesion.  In discussing, he reports nightly leg cramps (both legs) which are only made better by walking and getting up from bed.  They prevent him from sleeping well.  Plan to give trial of low-dose Requip at bedtime, he is to call back if not tolerated, and to make appointment in 1-2 months to follow up. Paula Compton, MD

## 2013-02-11 ENCOUNTER — Encounter (HOSPITAL_COMMUNITY)
Admission: RE | Admit: 2013-02-11 | Discharge: 2013-02-11 | Disposition: A | Discharge status: Home / Self Care | Payer: Medicare Other | Source: Ambulatory Visit | Attending: Nephrology | Admitting: Nephrology

## 2013-02-11 DIAGNOSIS — N184 Chronic kidney disease, stage 4 (severe): Secondary | ICD-10-CM | POA: Insufficient documentation

## 2013-02-11 DIAGNOSIS — D638 Anemia in other chronic diseases classified elsewhere: Secondary | ICD-10-CM | POA: Insufficient documentation

## 2013-02-11 LAB — COMPREHENSIVE METABOLIC PANEL
ALT: 12 U/L (ref 0–53)
AST: 18 U/L (ref 0–37)
Alkaline Phosphatase: 62 U/L (ref 39–117)
CO2: 24 mEq/L (ref 19–32)
Calcium: 9.7 mg/dL (ref 8.4–10.5)
Chloride: 102 mEq/L (ref 96–112)
GFR calc non Af Amer: 10 mL/min — ABNORMAL LOW (ref >90)
Potassium: 3.6 mEq/L (ref 3.5–5.1)
Sodium: 142 mEq/L (ref 135–145)

## 2013-02-11 LAB — FERRITIN: Ferritin: 110 ng/mL (ref 22–322)

## 2013-02-11 LAB — IRON AND TIBC: UIBC: 190 ug/dL (ref 125–400)

## 2013-02-11 MED ORDER — EPOETIN ALFA 10000 UNIT/ML IJ SOLN: 12000.0000 [IU] | INTRAMUSCULAR | Status: DC

## 2013-02-13 ENCOUNTER — Other Ambulatory Visit: Payer: Self-pay | Admitting: Family Medicine

## 2013-02-24 ENCOUNTER — Other Ambulatory Visit: Payer: Self-pay | Admitting: Family Medicine

## 2013-02-25 ENCOUNTER — Encounter (HOSPITAL_COMMUNITY)
Admission: RE | Admit: 2013-02-25 | Discharge: 2013-02-25 | Disposition: A | Discharge status: Home / Self Care | Payer: Medicare Other | Source: Ambulatory Visit | Attending: Nephrology | Admitting: Nephrology

## 2013-02-25 MED ORDER — EPOETIN ALFA 2000 UNIT/ML IJ SOLN
INTRAMUSCULAR | Status: AC
  Administered 2013-02-25: 2000 [IU] via SUBCUTANEOUS
  Filled 2013-02-25: qty 1

## 2013-02-25 MED ORDER — EPOETIN ALFA 10000 UNIT/ML IJ SOLN
INTRAMUSCULAR | Status: AC
  Administered 2013-02-25: 10000 [IU] via SUBCUTANEOUS
  Filled 2013-02-25: qty 1

## 2013-02-25 MED ORDER — EPOETIN ALFA 10000 UNIT/ML IJ SOLN: 12000.0000 [IU] | INTRAMUSCULAR | Status: DC

## 2013-03-10 ENCOUNTER — Ambulatory Visit (INDEPENDENT_AMBULATORY_CARE_PROVIDER_SITE_OTHER): Payer: Medicare Other | Admitting: Family Medicine

## 2013-03-10 ENCOUNTER — Encounter: Payer: Self-pay | Admitting: Family Medicine

## 2013-03-10 VITALS — BP 150/60 | HR 56 | Ht 70.0 in | Wt 163.6 lb

## 2013-03-10 DIAGNOSIS — G2581 Restless legs syndrome: Secondary | ICD-10-CM

## 2013-03-10 DIAGNOSIS — F4321 Adjustment disorder with depressed mood: Secondary | ICD-10-CM

## 2013-03-10 MED ORDER — ROPINIROLE HCL 0.25 MG PO TABS: ORAL_TABLET | ORAL | Status: DC

## 2013-03-10 NOTE — Assessment & Plan Note (Signed)
Adamantly denies depression, denies interest or thoughts of self-harm.  To continue to monitor. I have told Mr Scott Holmes that he may contact me with any concerns or worsening in this regard.

## 2013-03-10 NOTE — Progress Notes (Signed)
  Subjective:    Patient ID: Scott Holmes, male    DOB: 29-May-1932, 79 y.o.   MRN: [id number]  HPI Here for follow up of nighttime leg cramping.  He was started on Requip 0.25mg  nightly at last visit, and this has helped him a bit.  Still with some RLS symptoms at bedtime.   Has also noted that he has some pain below knee (describes on the R leg), leg "gives out". Does not have knee or ankle pain, no injury. No focal pain. Doesn't have it right now. Sounds intermittent and short-lived.   Says he was seen by Renal recently and had "good news" that his renal function in WORSENING.  Says "I wish it were zero"; reiterates his wishes to not be placed on HD, and reminds me that he wishes to be DNR and DNI.  Is adamant that he is not depressed, but rather that he is "ready to leave this world".   Denies any thoughts of self-harm.  Of note, anniversary of wife's death was in 2023/03/16 (one year since she died).   He still sees the grief counselor from hospice "as a friend" and that it makes him realize "there are other people worse off than I am".  Eager to sell his motor home.    Review of Systems No chest pain, no dyspnea.  No falls.      Objective:   Physical Exam Alert, oriented, no acute distress.  HEENT Neck supple, no cervical adenopathy.  COR regular S1S2 PULM Clear bilaterally.  LEs: edema 2+ bilat.  No calf tenderness, no cords or disparate calf girth.  No pain with active/passive knee flexion/extension on R or L knee.  No pain with flex/extension of ankles bilat.  GAIT unremarkable without assist.        Assessment & Plan:

## 2013-03-10 NOTE — Patient Instructions (Addendum)
It was a pleasure to see you today.  For the restless legs syndrome, we are increasing your Requip 0.25mg  to 2 tablets at bedtime.  I would like to see you back in another 2 months or sooner as needed.

## 2013-03-10 NOTE — Assessment & Plan Note (Signed)
Improved but not resolved with low-dose Requip.  Concern about increasing dose beyond 0.5mg /day in light of his renal failure.  Will increase to 0.5mg /night now.   I wonder if the R leg discomfort (intermittent) is also related to RLS.  I see no evidence of DVT or fx/injury (no point tenderness to palpate over long bones of legs, no joint pains with exam). Reassess in 1 to 2 months.

## 2013-03-11 ENCOUNTER — Encounter (HOSPITAL_COMMUNITY)
Admission: RE | Admit: 2013-03-11 | Discharge: 2013-03-11 | Disposition: A | Discharge status: Home / Self Care | Payer: Medicare Other | Source: Ambulatory Visit | Attending: Nephrology | Admitting: Nephrology

## 2013-03-11 DIAGNOSIS — D638 Anemia in other chronic diseases classified elsewhere: Secondary | ICD-10-CM | POA: Insufficient documentation

## 2013-03-11 DIAGNOSIS — N184 Chronic kidney disease, stage 4 (severe): Secondary | ICD-10-CM | POA: Insufficient documentation

## 2013-03-11 LAB — IRON AND TIBC
Iron: 75 ug/dL (ref 42–135)
Saturation Ratios: 27 % (ref 20–55)
TIBC: 282 ug/dL (ref 215–435)

## 2013-03-11 LAB — FERRITIN: Ferritin: 89 ng/mL (ref 22–322)

## 2013-03-11 MED ORDER — EPOETIN ALFA 10000 UNIT/ML IJ SOLN: 12000.0000 [IU] | INTRAMUSCULAR | Status: DC

## 2013-03-13 ENCOUNTER — Ambulatory Visit: Payer: Medicare Other | Admitting: Family Medicine

## 2013-03-16 ENCOUNTER — Ambulatory Visit (INDEPENDENT_AMBULATORY_CARE_PROVIDER_SITE_OTHER): Payer: Medicare Other | Admitting: Family Medicine

## 2013-03-16 ENCOUNTER — Encounter: Payer: Self-pay | Admitting: Family Medicine

## 2013-03-16 VITALS — BP 148/57 | HR 66 | Temp 98.5°F | Ht 70.0 in | Wt 165.0 lb

## 2013-03-16 DIAGNOSIS — R21 Rash and other nonspecific skin eruption: Secondary | ICD-10-CM | POA: Insufficient documentation

## 2013-03-16 MED ORDER — DOXYCYCLINE HYCLATE 100 MG PO TABS: 100.0000 mg | ORAL_TABLET | Freq: Two times a day (BID) | ORAL | Status: DC

## 2013-03-16 NOTE — Patient Instructions (Addendum)
Start antibiotics for leg infection- docycycline  Come back for recheck in 2 days  If worsening, or having fever, chills, or signs of rapid spreading, come back sooner

## 2013-03-16 NOTE — Assessment & Plan Note (Addendum)
Acute rash, possible tick related.  Cellulitis No sysetmic signs of illness.  Will rx docycycline x 14 days, patient to return for recheck in several days.  Does not appear to be consistent with superficial thrombophlebitis or DVT at this time.

## 2013-03-16 NOTE — Progress Notes (Addendum)
  Subjective:    Patient ID: Scott Holmes, male    DOB: 1932/04/19, 79 y.o.   MRN: [id number]  HPI 79 yo here for work in appt for right leg pain  Total 2 week duration.  Was seen by PCP about 6 days ago.   Patient reports rash appeared 5 days ago.  Continued pain.  Pulled a tick off his leg yesterday.  Checks daily- Does not think it was there > 24 hours.  No fever, chills, pain, rash in other areas, headache.   Review of Systemssee HPI     Objective:   Physical Exam  GEN: NAD Leg:  Rash as below on right lower extremity.  Small erythematous area above rash identified as site of tick removal- no foreign body noted. No palpable cord.  Good cap refill.  Mildly tender and warm.  LE edema 1+ pretibial- consistent with the other leg- chronic.     Assessment & Plan:

## 2013-03-20 ENCOUNTER — Ambulatory Visit (INDEPENDENT_AMBULATORY_CARE_PROVIDER_SITE_OTHER): Payer: Medicare Other | Admitting: Family Medicine

## 2013-03-20 ENCOUNTER — Encounter: Payer: Self-pay | Admitting: Family Medicine

## 2013-03-20 VITALS — BP 176/74 | HR 63 | Temp 97.8°F | Ht 70.0 in | Wt 164.5 lb

## 2013-03-20 DIAGNOSIS — A6920 Lyme disease, unspecified: Secondary | ICD-10-CM

## 2013-03-20 LAB — CBC WITH DIFFERENTIAL/PLATELET
Basophils Relative: 0 % (ref 0–1)
Eosinophils Absolute: 0.5 10*3/uL (ref 0.0–0.7)
HCT: 32.9 % — ABNORMAL LOW (ref 39.0–52.0)
Hemoglobin: 11 g/dL — ABNORMAL LOW (ref 13.0–17.0)
MCH: 31.4 pg (ref 26.0–34.0)
MCHC: 33.4 g/dL (ref 30.0–36.0)
Monocytes Absolute: 0.5 10*3/uL (ref 0.1–1.0)
Monocytes Relative: 6 % (ref 3–12)
Neutrophils Relative %: 73 % (ref 43–77)

## 2013-03-20 MED ORDER — DOXYCYCLINE HYCLATE 100 MG PO TABS: ORAL_TABLET | ORAL | Status: DC

## 2013-03-20 MED ORDER — DOXYCYCLINE HYCLATE 100 MG PO TABS: 100.0000 mg | ORAL_TABLET | Freq: Two times a day (BID) | ORAL | Status: DC

## 2013-03-20 NOTE — Progress Notes (Signed)
  Subjective:    Patient ID: Scott Holmes, male    DOB: 01/13/1932, 79 y.o.   MRN: [id number]  HPI Here for follow up of skin rash on right leg; seen here on 5/12 at which time he reported having found a tick on leg in the area where redness developed.  At that time a larger area of erythema had developed distal to the site of tick bite (see photo in that note).  He was started on doxycycline at that time, which he has taken for 3 days.  No fevers or chills, no headaches or malaise.  Since that visit, the initial distal area of maximal erythema has improved dramatically, however the site of initial tick bite is more red than previously (see photo below, area to the right of the pen mark indicating prior maximal redness).  He has also developed another separate area of redness along the medial aspect of the left leg.  No calf tenderness, no pain with walking.  He reports that he has seen several ticks on himself but is careful to check himself daily for them and remove promptly.       Review of Systems See above    Objective:   Physical Exam Generally well appearing, no apparent distress HEENT Neck supple, no cervical adenopathy.  SKIN: Area of identified tick bite with surrounding erythema (see photo above); marked improvement in prior area of redness, which is nonblanching and mildly warm and without fluctuance.  Oval area of erythema of similar surface area to the area surrounding the tick bite (above) on the medial aspect of the L lower leg.  2-3+ bilatrer lower extremity edema which is in keeping with his prior exams before this episode of illness.        Assessment & Plan:

## 2013-03-20 NOTE — Patient Instructions (Addendum)
It was a pleasure to see you today.  I believe your rash is related to the tick bite.  For this reason I am extending your doxycycline 100mg  twice daily for a TOTAL OF 21 DAYS (I sent in the prescription for the additional 7 days to your pharmacy already).  I am ordering a blood count today.  I would like you to see me in 1 week.  Please call/come in if you experience fevers/chills, headaches, nausea/vomiting, or continued extension/worsening of the rash.   FRONT OFFICE STAFF: PLEASE SCHEDULE Scott Holmes WITH ME IN 1 WEEK.  IT IS FINE TO DOUBLE-BOOK HIM IF NEEDED. THANKS, JB

## 2013-03-20 NOTE — Assessment & Plan Note (Signed)
Recently identified to have been bitten by tick and now with migrating erythematous skin rashes.  He is already on doxycycline (course intended for 14 days), I am extending to 21 total days for treatment of suspected EM.  He does not have fevers or chills, HAs, or other signs/sx of worsening systemic illness. The exam does not look like a typical cellulitis/soft tissue infection, and with the new area on the contralateral leg this does not support the diagnosis of common SSTI.  While Scott Holmes recalls starting with this rash in the days preceding his finding the tick on his R leg, he does admit to finding ticks frequently where [place] in [place].  Therefore, it is certainly possible that he was exposed to a tick prior to the one he eventual found at the onset of this episode of illness. We discussed s/sx that should prompt more urgent evaluation.  I am checking CBC with diff today; plan for scheduled follow up with me in one week.

## 2013-03-23 ENCOUNTER — Telehealth: Payer: Self-pay | Admitting: Family Medicine

## 2013-03-23 NOTE — Telephone Encounter (Signed)
Called patient to inquire about his welfare and to let him know that his lab results from Friday's visit are encouraging.   I left a voice mail message for him.  JB

## 2013-03-25 ENCOUNTER — Encounter (HOSPITAL_COMMUNITY)
Admission: RE | Admit: 2013-03-25 | Discharge: 2013-03-25 | Disposition: A | Discharge status: Home / Self Care | Payer: Medicare Other | Source: Ambulatory Visit | Attending: Nephrology | Admitting: Nephrology

## 2013-03-25 LAB — POCT HEMOGLOBIN-HEMACUE: Hemoglobin: 10.8 g/dL — ABNORMAL LOW (ref 13.0–17.0)

## 2013-03-25 MED ORDER — EPOETIN ALFA 10000 UNIT/ML IJ SOLN: 12000.0000 [IU] | INTRAMUSCULAR | Status: DC

## 2013-03-25 MED ORDER — EPOETIN ALFA 10000 UNIT/ML IJ SOLN
INTRAMUSCULAR | Status: AC
  Administered 2013-03-25: 10000 [IU] via SUBCUTANEOUS
  Filled 2013-03-25: qty 1

## 2013-03-25 MED ORDER — EPOETIN ALFA 2000 UNIT/ML IJ SOLN
INTRAMUSCULAR | Status: AC
  Administered 2013-03-25: 2000 [IU] via SUBCUTANEOUS
  Filled 2013-03-25: qty 1

## 2013-03-27 ENCOUNTER — Encounter: Payer: Self-pay | Admitting: Family Medicine

## 2013-03-27 ENCOUNTER — Ambulatory Visit (INDEPENDENT_AMBULATORY_CARE_PROVIDER_SITE_OTHER): Payer: Medicare Other | Admitting: Family Medicine

## 2013-03-27 ENCOUNTER — Ambulatory Visit (HOSPITAL_COMMUNITY)
Admission: RE | Admit: 2013-03-27 | Discharge: 2013-03-27 | Disposition: A | Discharge status: Home / Self Care | Payer: Medicare Other | Source: Ambulatory Visit | Attending: Family Medicine | Admitting: Family Medicine

## 2013-03-27 VITALS — BP 154/66 | HR 61 | Temp 97.9°F | Ht 70.0 in | Wt 166.0 lb

## 2013-03-27 DIAGNOSIS — L539 Erythematous condition, unspecified: Secondary | ICD-10-CM | POA: Insufficient documentation

## 2013-03-27 DIAGNOSIS — M25569 Pain in unspecified knee: Secondary | ICD-10-CM | POA: Insufficient documentation

## 2013-03-27 DIAGNOSIS — A6920 Lyme disease, unspecified: Secondary | ICD-10-CM

## 2013-03-27 DIAGNOSIS — M79609 Pain in unspecified limb: Secondary | ICD-10-CM

## 2013-03-27 DIAGNOSIS — M7989 Other specified soft tissue disorders: Secondary | ICD-10-CM

## 2013-03-27 DIAGNOSIS — N289 Disorder of kidney and ureter, unspecified: Secondary | ICD-10-CM | POA: Insufficient documentation

## 2013-03-27 MED ORDER — MUPIROCIN 2 % EX OINT: TOPICAL_OINTMENT | Freq: Three times a day (TID) | CUTANEOUS | Status: DC

## 2013-03-27 MED ORDER — TRAMADOL HCL 50 MG PO TABS: 50.0000 mg | ORAL_TABLET | Freq: Two times a day (BID) | ORAL | Status: DC | PRN

## 2013-03-27 NOTE — Progress Notes (Signed)
  Subjective:    Patient ID: Scott Holmes, male    DOB: 1932-05-14, 79 y.o.   MRN: [id number]  HPI Scott Holmes is here for follow up of redness in legs (R more than L) and R leg pain.  He is taking doxycycline for concern for EM associated with a deer tick he found on himself at about the time he noticed the rash.  He is quick to point out that he pulled the tick off his R leg a day or two BEFORE he saw the redness/rash; however, he also states that he frequently sees ticks on himself and does daily checks for ticks.  Since his visit last week, the redness has nearly completed resolved.  He scraped his R shin and it is red, but not associated with the prior rash.  No fevers or chills.  He has pain along the posteromedial aspect of the right leg, along the calf and ankle, which keeps him awake at night.  Some swelling as well.   Review of Systems See HPI    Objective:   Physical Exam Well appearing, no apparent distress HEENT Neck supple.  SKIN: R lower leg and L lower leg erythema is now completely resolved; area of abrasion along R shin that has mild erythema around border.  No discharge/purulence.  Bilateral 2+ pitting edema in ankles.  Tenderness to palpate along the R medial calf; no popliteal tenderness.  No redness or effusion of the right knee.         Assessment & Plan:

## 2013-03-27 NOTE — Assessment & Plan Note (Signed)
Swelling and tenderness of right calf; more likely cause is MSK, however need to rule out VTE as potential cause in light of presentation.  Will send for venous doppler US today.  I received call from [place] lab, doppler US is negative for DVT or Baker's cyst.   Mr. Scott Holmes comes back to office to discuss.  We will try tramadol every 12 hours as needed for pain; follow up in 1 week if not improved.  He is to call if worsens or if any other problems/concerns.

## 2013-03-27 NOTE — Patient Instructions (Addendum)
It was good to see you today.  I am sending you for ultrasound of the right leg to rule out a clot.    For the wound on the right shin, I sent in a prescription for mupirocin topical antibiotic ointment, apply 2 to 3 times daily.  Keep in the refrigerator for future use as needed.   I will call you with the results of the ultrasound when I get it.

## 2013-03-27 NOTE — Progress Notes (Signed)
VASCULAR LAB PRELIMINARY  PRELIMINARY  PRELIMINARY  PRELIMINARY  Right lower extremity venous duplex completed.    Preliminary report: Negative for deep and superficial vein thrombosis of the right leg.  Report called to Dr. Mauricio Po.   Liliana Dang, RVT 03/27/2013, 2:02 PM

## 2013-03-27 NOTE — Assessment & Plan Note (Signed)
Lower extremity rash that resolved after being put on doxycycline 100mg  twice daily; to complete the 21-day course.

## 2013-04-03 ENCOUNTER — Encounter: Payer: Self-pay | Admitting: Family Medicine

## 2013-04-03 ENCOUNTER — Ambulatory Visit (INDEPENDENT_AMBULATORY_CARE_PROVIDER_SITE_OTHER): Payer: Medicare Other | Admitting: Family Medicine

## 2013-04-03 VITALS — BP 160/69 | HR 59 | Ht 70.0 in | Wt 164.3 lb

## 2013-04-03 DIAGNOSIS — M25569 Pain in unspecified knee: Secondary | ICD-10-CM

## 2013-04-03 DIAGNOSIS — M25561 Pain in right knee: Secondary | ICD-10-CM

## 2013-04-03 NOTE — Patient Instructions (Addendum)
It was a pleasure to see you today; I am glad the rash on your legs is better.  I would like you to call me after you see Dr Kellie Simmering next Thursday (June 5th); I would like to know what his thoughts are about the pain down your right leg before we decide whether a referral to Sports Medicine (Dr Darrick Penna' practice) would be in order.   I would like to see you back in about one month, or sooner if needed.

## 2013-04-03 NOTE — Assessment & Plan Note (Signed)
Pain across right lateral thigh and leg; appears to be dermatomal in distribution, without associated skin findings or motor/sensory deficits.  Curious description of right-sided facial involvement.  He is going to see Dr Kellie Simmering, his rheumatologist, in one week-- while I do not think this is likely Rheum in origin, would be interested in Dr Ines Bloomer appraisal of the issue.  Scott Holmes's GFR of 10 makes me reluctant to try empiric gabapentin for this. May consider Sports Med consult for further evaluation after Rheumatology visit; Scott Holmes will call me and we will discuss how he is doing late next week.

## 2013-04-03 NOTE — Progress Notes (Signed)
  Subjective:    Patient ID: Scott Holmes, male    DOB: 06-Jul-1932, 79 y.o.   MRN: [id number]  HPI Mr Kussman comes in today for follow up on his R leg pain.  Says it has not changed since last week.  Is not taking any medications for it now.  Had been on doxycycline for 21 days after sudden appearance of a tick on medial aspect of right leg with surrounding erythema, then eruption of erythematous lesions elsewhere on right leg (and one on the postero-medial left leg), which resolved shortly after treatment with doxy.  Given his high exposure to deer ticks and the course of these lesions, it was thought that he could have had erythema migrans.  His 21-day course of doxy ended this past Monday, May 26th.  Pain in the right leg extends along the lateral aspect of distal thigh, across the lateral aspect of the right knee and along the anterior shin down to the anterio ankle.  He has noticed some increase in swelling.  He had a venous doppler US done at last visit with me that was negative for DVT or Bakers cyst.  He notes associated tingling and intermittent pain along the right side of his face.  No weakness or dysarthria; no visual changes.  No falls since last visit.    Review of Systems See above    Objective:   Physical Exam Well appearing, somewhat flat affect.  HEENT Neck supple MSK: area of focal erythema around abrasion on right anterior shin.  Large erythematous lesions on R leg are resolved, as is the one that was previously on the L leg.  2-3+ pitting edema in both ankles.  No foot lesions.  Palpable dp pulses bilaterally. Full strength and active ROM in both ankles.  No knee effusions noted, no joint line tenderness in knees. Full active ROM of both knees and hips.  No lateral leg skin lesions.  NEURO: Walks without assistance, normal gait. No ptosis.  EOMI.  Smile Symmetric.  Brow raise symmetric.  Speech fluid.  Shoulder shrug symmetric.  Sensation along three branches of trigeminal  nerve intact. Handgrip intact and symmetric.  Hip/knee flexion-extension full and symmetric. Dors-/plantarflexion of both ankles full and symmetric.  Sensation in both feet grossly intact and symmetric.        Assessment & Plan:

## 2013-04-08 ENCOUNTER — Encounter (HOSPITAL_COMMUNITY)
Admission: RE | Admit: 2013-04-08 | Discharge: 2013-04-08 | Disposition: A | Discharge status: Home / Self Care | Payer: Medicare Other | Source: Ambulatory Visit | Attending: Nephrology | Admitting: Nephrology

## 2013-04-08 DIAGNOSIS — N184 Chronic kidney disease, stage 4 (severe): Secondary | ICD-10-CM | POA: Insufficient documentation

## 2013-04-08 DIAGNOSIS — D638 Anemia in other chronic diseases classified elsewhere: Secondary | ICD-10-CM | POA: Insufficient documentation

## 2013-04-08 LAB — IRON AND TIBC
Saturation Ratios: 35 % (ref 20–55)
TIBC: 229 ug/dL (ref 215–435)

## 2013-04-08 LAB — POCT HEMOGLOBIN-HEMACUE: Hemoglobin: 10.5 g/dL — ABNORMAL LOW (ref 13.0–17.0)

## 2013-04-08 MED ORDER — EPOETIN ALFA 2000 UNIT/ML IJ SOLN
INTRAMUSCULAR | Status: AC
  Administered 2013-04-08: 2000 [IU] via SUBCUTANEOUS
  Filled 2013-04-08: qty 1

## 2013-04-08 MED ORDER — EPOETIN ALFA 10000 UNIT/ML IJ SOLN: 12000.0000 [IU] | INTRAMUSCULAR | Status: DC

## 2013-04-08 MED ORDER — EPOETIN ALFA 10000 UNIT/ML IJ SOLN
INTRAMUSCULAR | Status: AC
  Administered 2013-04-08: 10000 [IU] via SUBCUTANEOUS
  Filled 2013-04-08: qty 1

## 2013-04-10 ENCOUNTER — Telehealth: Payer: Self-pay | Admitting: Family Medicine

## 2013-04-10 NOTE — Telephone Encounter (Signed)
Thank you for the follow up.   JB

## 2013-04-10 NOTE — Telephone Encounter (Signed)
Patient saw Dr. Kellie Simmering 04/09/2013 and was diagnosed with gout of right leg.  Dr. Kellie Simmering started patient on a 10 day taper dose of Prednisone 10mg  and has a f/u appt on 04/20/2013.  Patient wanted to let Dr. Mauricio Po know.  Will fwd to Md.  Susannah Carbin, Darlyne Russian, CMA

## 2013-04-10 NOTE — Telephone Encounter (Signed)
Patient is calling Dr. Mauricio Po to discuss his appt. with Dr. Kellie Simmering from yesterday.

## 2013-04-22 ENCOUNTER — Encounter (HOSPITAL_COMMUNITY)
Admission: RE | Admit: 2013-04-22 | Discharge: 2013-04-22 | Disposition: A | Discharge status: Home / Self Care | Payer: Medicare Other | Source: Ambulatory Visit | Attending: Nephrology | Admitting: Nephrology

## 2013-04-22 MED ORDER — EPOETIN ALFA 10000 UNIT/ML IJ SOLN
INTRAMUSCULAR | Status: AC
  Administered 2013-04-22: 10000 [IU] via SUBCUTANEOUS
  Filled 2013-04-22: qty 1

## 2013-04-22 MED ORDER — EPOETIN ALFA 2000 UNIT/ML IJ SOLN
INTRAMUSCULAR | Status: AC
  Administered 2013-04-22: 2000 [IU] via SUBCUTANEOUS
  Filled 2013-04-22: qty 1

## 2013-04-22 MED ORDER — EPOETIN ALFA 10000 UNIT/ML IJ SOLN: 12000.0000 [IU] | INTRAMUSCULAR | Status: DC

## 2013-04-23 LAB — POCT HEMOGLOBIN-HEMACUE: Hemoglobin: 10.3 g/dL — ABNORMAL LOW (ref 13.0–17.0)

## 2013-04-30 ENCOUNTER — Other Ambulatory Visit: Payer: Self-pay | Admitting: *Deleted

## 2013-04-30 ENCOUNTER — Other Ambulatory Visit: Payer: Self-pay | Admitting: Family Medicine

## 2013-04-30 MED ORDER — SIMVASTATIN 20 MG PO TABS: ORAL_TABLET | ORAL | Status: DC

## 2013-05-05 ENCOUNTER — Encounter (HOSPITAL_COMMUNITY): Payer: Medicare Other

## 2013-05-06 ENCOUNTER — Encounter (HOSPITAL_COMMUNITY)
Admission: RE | Admit: 2013-05-06 | Discharge: 2013-05-06 | Disposition: A | Discharge status: Home / Self Care | Payer: Medicare Other | Source: Ambulatory Visit | Attending: Nephrology | Admitting: Nephrology

## 2013-05-06 DIAGNOSIS — D638 Anemia in other chronic diseases classified elsewhere: Secondary | ICD-10-CM | POA: Insufficient documentation

## 2013-05-06 DIAGNOSIS — N184 Chronic kidney disease, stage 4 (severe): Secondary | ICD-10-CM | POA: Insufficient documentation

## 2013-05-06 LAB — IRON AND TIBC
Saturation Ratios: 25 % (ref 20–55)
TIBC: 275 ug/dL (ref 215–435)
UIBC: 207 ug/dL (ref 125–400)

## 2013-05-06 LAB — FERRITIN: Ferritin: 98 ng/mL (ref 22–322)

## 2013-05-06 MED ORDER — EPOETIN ALFA 20000 UNIT/ML IJ SOLN
INTRAMUSCULAR | Status: AC
  Administered 2013-05-06: 12000 [IU] via SUBCUTANEOUS
  Filled 2013-05-06: qty 1

## 2013-05-06 MED ORDER — EPOETIN ALFA 10000 UNIT/ML IJ SOLN: 12000.0000 [IU] | INTRAMUSCULAR | Status: DC

## 2013-05-06 MED ORDER — EPOETIN ALFA 10000 UNIT/ML IJ SOLN
INTRAMUSCULAR | Status: AC
  Filled 2013-05-06: qty 1

## 2013-05-07 LAB — PTH, INTACT AND CALCIUM
Calcium, Total (PTH): 8.6 mg/dL (ref 8.4–10.5)
PTH: 106.4 pg/mL — ABNORMAL HIGH (ref 14.0–72.0)

## 2013-05-20 ENCOUNTER — Encounter (HOSPITAL_COMMUNITY)
Admission: RE | Admit: 2013-05-20 | Discharge: 2013-05-20 | Disposition: A | Discharge status: Home / Self Care | Payer: Medicare Other | Source: Ambulatory Visit | Attending: Nephrology | Admitting: Nephrology

## 2013-05-20 LAB — URIC ACID: Uric Acid, Serum: 7.1 mg/dL (ref 4.0–7.8)

## 2013-05-20 LAB — COMPREHENSIVE METABOLIC PANEL
Albumin: 3.7 g/dL (ref 3.5–5.2)
BUN: 92 mg/dL — ABNORMAL HIGH (ref 6–23)
Creatinine, Ser: 5.95 mg/dL — ABNORMAL HIGH (ref 0.50–1.35)
GFR calc Af Amer: 9 mL/min — ABNORMAL LOW (ref >90)
Glucose, Bld: 121 mg/dL — ABNORMAL HIGH (ref 70–99)
Total Protein: 7.3 g/dL (ref 6.0–8.3)

## 2013-05-20 LAB — PHOSPHORUS: Phosphorus: 6.6 mg/dL — ABNORMAL HIGH (ref 2.3–4.6)

## 2013-05-20 MED ORDER — EPOETIN ALFA 10000 UNIT/ML IJ SOLN
INTRAMUSCULAR | Status: AC
  Filled 2013-05-20: qty 1

## 2013-05-20 MED ORDER — EPOETIN ALFA 20000 UNIT/ML IJ SOLN
INTRAMUSCULAR | Status: AC
  Administered 2013-05-20: 20000 [IU] via SUBCUTANEOUS
  Filled 2013-05-20: qty 1

## 2013-05-20 MED ORDER — EPOETIN ALFA 10000 UNIT/ML IJ SOLN: 12000.0000 [IU] | INTRAMUSCULAR | Status: DC

## 2013-05-21 MED FILL — Epoetin Alfa Inj 10000 Unit/ML: INTRAMUSCULAR | Qty: 1 | Status: AC

## 2013-05-26 ENCOUNTER — Ambulatory Visit (INDEPENDENT_AMBULATORY_CARE_PROVIDER_SITE_OTHER): Payer: Medicare Other | Admitting: Sports Medicine

## 2013-05-26 ENCOUNTER — Other Ambulatory Visit: Payer: Self-pay | Admitting: Family Medicine

## 2013-05-26 VITALS — BP 158/73 | Ht 70.0 in | Wt 173.0 lb

## 2013-05-26 DIAGNOSIS — M25561 Pain in right knee: Secondary | ICD-10-CM

## 2013-05-26 DIAGNOSIS — M25569 Pain in unspecified knee: Secondary | ICD-10-CM

## 2013-05-26 NOTE — Patient Instructions (Addendum)
Do exercises while holding onto something for balance  Do side leg lifts while standing- do 8 at a time  Bring your knee up to center and rotate out to the side- repeat 8 times  Take tramadol twice daily  Please follow up in 6 weeks  Thank you for seeing [place] today!

## 2013-05-26 NOTE — Assessment & Plan Note (Signed)
Now with generalized RT lower leg pain More radiating down lateral leg  This acts like it is radicular No clear site of nerve impingement but may well have some LS DDD or DJD  Considering his CRF and multiple problems I recommended exercises to work on weakness Begin Tramadol bid as this is a benign RX compared to some other options Reck by me in 1 mo

## 2013-05-26 NOTE — Progress Notes (Signed)
  Subjective:    Patient ID: ORANGE HILLIGOSS, male    DOB: 1932/08/20, 79 y.o.   MRN: [id number]  HPI  Pt presents to clinic for evaluation of rt lateral leg pain from ankle to hip since May. He has tried a course of prednisone and doxycycline for possible gout attack or lyme disease, these were not helpful.  Has been evaluated by Dr. Mauricio Po and Dr. Kellie Simmering.  Had a tick bite on lower rt leg mid may. -- but he says leg pain started 5 days before the tick bite  Does not have any back pain, states rt leg pain is constant 5-7 on pain scale of 10.  Initially thought gout by Dr Kellie Simmering but he did not think so on Follow up exam  Saw his renal doctor - Dr Eliott Nine who felt that some of swelling and pain was from CRF (severe limitation of GFR) but not pain radiating down lateral leg  Wife died last 02-15-2023 Still tearful over this   Review of Systems     Objective:   Physical Exam  Copper tinting of skin/ pleasant/ NAD  Swollen olecranon bursa on lt elbow, lacks 10 degrees extension  2+ pitting edema on lt, 3+ on rt Pressure over rt fibular head does not cause joint pain  Good hip ROM on log roll bilat Hip abduction weakness on rt, not weak on lt Hip flexion strong bilat No ttp over rt SI joint SI joints move bilat Negative straight leg raise bilat         Assessment & Plan:

## 2013-05-27 ENCOUNTER — Other Ambulatory Visit: Payer: Self-pay | Admitting: Family Medicine

## 2013-05-27 NOTE — Telephone Encounter (Signed)
Refill printed and put in fax box.  JB

## 2013-06-02 ENCOUNTER — Other Ambulatory Visit (HOSPITAL_COMMUNITY): Payer: Self-pay | Admitting: *Deleted

## 2013-06-03 ENCOUNTER — Encounter (HOSPITAL_COMMUNITY)
Admission: RE | Admit: 2013-06-03 | Discharge: 2013-06-03 | Disposition: A | Discharge status: Home / Self Care | Payer: Medicare Other | Source: Ambulatory Visit | Attending: Nephrology | Admitting: Nephrology

## 2013-06-03 LAB — IRON AND TIBC
Iron: 50 ug/dL (ref 42–135)
TIBC: 301 ug/dL (ref 215–435)

## 2013-06-03 LAB — FERRITIN: Ferritin: 80 ng/mL (ref 22–322)

## 2013-06-03 MED ORDER — EPOETIN ALFA 10000 UNIT/ML IJ SOLN
INTRAMUSCULAR | Status: AC
  Filled 2013-06-03: qty 1

## 2013-06-03 MED ORDER — EPOETIN ALFA 10000 UNIT/ML IJ SOLN: 12000.0000 [IU] | INTRAMUSCULAR | Status: DC

## 2013-06-03 MED ORDER — EPOETIN ALFA 20000 UNIT/ML IJ SOLN
INTRAMUSCULAR | Status: AC
  Administered 2013-06-03: 12000 [IU] via SUBCUTANEOUS
  Filled 2013-06-03: qty 1

## 2013-06-03 MED ORDER — EPOETIN ALFA 2000 UNIT/ML IJ SOLN
INTRAMUSCULAR | Status: AC
  Filled 2013-06-03: qty 1

## 2013-06-16 ENCOUNTER — Other Ambulatory Visit (HOSPITAL_COMMUNITY): Payer: Self-pay | Admitting: *Deleted

## 2013-06-17 ENCOUNTER — Encounter (HOSPITAL_COMMUNITY)
Admission: RE | Admit: 2013-06-17 | Discharge: 2013-06-17 | Disposition: A | Discharge status: Home / Self Care | Payer: Medicare Other | Source: Ambulatory Visit | Attending: Nephrology | Admitting: Nephrology

## 2013-06-17 DIAGNOSIS — N184 Chronic kidney disease, stage 4 (severe): Secondary | ICD-10-CM | POA: Insufficient documentation

## 2013-06-17 DIAGNOSIS — D638 Anemia in other chronic diseases classified elsewhere: Secondary | ICD-10-CM | POA: Insufficient documentation

## 2013-06-17 MED ORDER — EPOETIN ALFA 20000 UNIT/ML IJ SOLN
INTRAMUSCULAR | Status: AC
  Administered 2013-06-17: 12000 [IU] via SUBCUTANEOUS
  Filled 2013-06-17: qty 1

## 2013-06-17 MED ORDER — EPOETIN ALFA 10000 UNIT/ML IJ SOLN: 12000.0000 [IU] | INTRAMUSCULAR | Status: DC

## 2013-06-26 ENCOUNTER — Other Ambulatory Visit: Payer: Self-pay | Admitting: Family Medicine

## 2013-06-30 ENCOUNTER — Ambulatory Visit (INDEPENDENT_AMBULATORY_CARE_PROVIDER_SITE_OTHER): Payer: Medicare Other | Admitting: Sports Medicine

## 2013-06-30 VITALS — BP 163/72 | Ht 70.0 in | Wt 171.0 lb

## 2013-06-30 DIAGNOSIS — R112 Nausea with vomiting, unspecified: Secondary | ICD-10-CM

## 2013-06-30 DIAGNOSIS — M25561 Pain in right knee: Secondary | ICD-10-CM

## 2013-06-30 DIAGNOSIS — M25569 Pain in unspecified knee: Secondary | ICD-10-CM

## 2013-06-30 MED ORDER — OMEPRAZOLE 40 MG PO CPDR: 40.0000 mg | DELAYED_RELEASE_CAPSULE | Freq: Every day | ORAL | Status: DC

## 2013-06-30 MED ORDER — TRAMADOL HCL 50 MG PO TABS: ORAL_TABLET | ORAL | Status: DC

## 2013-06-30 NOTE — Assessment & Plan Note (Signed)
Pt Sx consistent with radicular pain.  Due to his CKD stage IV, limited in therapeutic options for him, including surgical tx.  He did not take the tramadol regularly due to the SE of drowsiness it gave him.  Recommended taking 1/2 tab BID as tolerated and will see back in 4 weeks.  Continue strengthening exercises as well.

## 2013-06-30 NOTE — Progress Notes (Signed)
Scott Holmes is a 79 y.o. male who presents today for recalcitrant RLE pain that has been ongoing for the past several months now.  He was seen in [facility] on 7/22 and thought to have radicular pain of the RLE.  At that time, he was Rx Ultram along with strengthening exercises of the lower extremity and back.   However, pt has not been able to take the tramadol more than a couple of times due to it causing him to feel loopy.  His pain is un resolving despite exercises and his therapeutic options are limited due to CKD stage IV.  His pain is located over the lateral lower extremity with some aspect of the dorsal foot.  He denies any numbness/tingling/burning into his distal feet, low back pain, weight loss, fever, chills, bowel or bladder incontinence.     ROS: Per HPI.  All other systems reviewed and are negative.   Physical Exam Filed Vitals:   06/30/13 1332  BP: 163/72    Physical Examination:  NAD  +2 Pitting edema B/L LE, no gross deformity  Pressure over rt fibular head does not cause joint pain  Good hip ROM, knee ROM, ankle ROM  Hip abduction weakness on rt, not weak on lt  Hip flexion strong bilat  No ttp over rt SI joint  Negative straight leg raise bilat Neurovascular intact B/L LE

## 2013-07-01 ENCOUNTER — Encounter (HOSPITAL_COMMUNITY)
Admission: RE | Admit: 2013-07-01 | Discharge: 2013-07-01 | Disposition: A | Discharge status: Home / Self Care | Payer: Medicare Other | Source: Ambulatory Visit | Attending: Nephrology | Admitting: Nephrology

## 2013-07-01 LAB — IRON AND TIBC
Iron: 71 ug/dL (ref 42–135)
Saturation Ratios: 23 % (ref 20–55)
TIBC: 303 ug/dL (ref 215–435)
UIBC: 232 ug/dL (ref 125–400)

## 2013-07-01 LAB — FERRITIN: Ferritin: 76 ng/mL (ref 22–322)

## 2013-07-01 LAB — POCT HEMOGLOBIN-HEMACUE: Hemoglobin: 11.6 g/dL — ABNORMAL LOW (ref 13.0–17.0)

## 2013-07-01 MED ORDER — EPOETIN ALFA 10000 UNIT/ML IJ SOLN: 12000.0000 [IU] | INTRAMUSCULAR | Status: DC

## 2013-07-01 MED ORDER — EPOETIN ALFA 20000 UNIT/ML IJ SOLN
INTRAMUSCULAR | Status: AC
  Administered 2013-07-01: 12000 [IU] via SUBCUTANEOUS
  Filled 2013-07-01: qty 1

## 2013-07-05 ENCOUNTER — Other Ambulatory Visit: Payer: Self-pay | Admitting: Family Medicine

## 2013-07-15 ENCOUNTER — Encounter (HOSPITAL_COMMUNITY)
Admission: RE | Admit: 2013-07-15 | Discharge: 2013-07-15 | Disposition: A | Discharge status: Home / Self Care | Payer: Medicare Other | Source: Ambulatory Visit | Attending: Nephrology | Admitting: Nephrology

## 2013-07-15 DIAGNOSIS — D638 Anemia in other chronic diseases classified elsewhere: Secondary | ICD-10-CM | POA: Insufficient documentation

## 2013-07-15 DIAGNOSIS — N184 Chronic kidney disease, stage 4 (severe): Secondary | ICD-10-CM | POA: Insufficient documentation

## 2013-07-15 LAB — POCT HEMOGLOBIN-HEMACUE: Hemoglobin: 10.3 g/dL — ABNORMAL LOW (ref 13.0–17.0)

## 2013-07-15 MED ORDER — EPOETIN ALFA 10000 UNIT/ML IJ SOLN: 12000.0000 [IU] | INTRAMUSCULAR | Status: DC

## 2013-07-15 MED ORDER — EPOETIN ALFA 20000 UNIT/ML IJ SOLN
INTRAMUSCULAR | Status: AC
  Administered 2013-07-15: 12000 [IU] via SUBCUTANEOUS
  Filled 2013-07-15: qty 1

## 2013-07-17 ENCOUNTER — Ambulatory Visit (INDEPENDENT_AMBULATORY_CARE_PROVIDER_SITE_OTHER): Payer: Medicare Other | Admitting: Family Medicine

## 2013-07-17 ENCOUNTER — Encounter: Payer: Self-pay | Admitting: Family Medicine

## 2013-07-17 VITALS — BP 121/62 | HR 56 | Ht 70.0 in | Wt 174.0 lb

## 2013-07-17 DIAGNOSIS — R609 Edema, unspecified: Secondary | ICD-10-CM

## 2013-07-17 DIAGNOSIS — R600 Localized edema: Secondary | ICD-10-CM | POA: Insufficient documentation

## 2013-07-17 NOTE — Progress Notes (Signed)
  Subjective:    Patient ID: Scott Holmes, male    DOB: 1932/06/14, 79 y.o.   MRN: [id number]  HPI Patient presents today for follow up.  Has had bilateral leg soreness that worsened on Sunday morning (Sept 7) when he got out of bed.  Had to use a cane to get around SUn and Mon.  Affected legs, both knees and ankles.  No hot or red joints.  Gradually has improved to the point where he no longer needs a cane.  No focal weakness described in this history.  Does not feel like the pain he had when diagnosed with sciatica of the R leg recently by Dr Darrick Penna at [facility].  Has not seen skin changes or rashes.  No fevers or chills. Has been taking tramadol for the sciatica, which may be helping somewhat.  Forgets to take it sometimes.   States that his biggest concern is "not being able to get around for myself-- I don't want to put anybody through no trouble."  Son comes by on Tuesdays; has a neighbor who can help in an emergency. Is clear in telling me that he would not want to be taken to a hospital if he were to have a medical emergency in his home. "I am not afraid of dying."  Saw Dr Eliott Nine for his renal failure recently; in CKD stage IV.   Review of Systems See above     Objective:   Physical Exam GENERAL: Sallow appearance; flat affect but in no acute distress.  HEENT Neck supple.  COR Regular S1S2 PULM Clear bilaterally.  EXTS: 2+ pitting edema in both legs from ankles to knees; symmetric. No cords, no disparate calf girth.  No erythema of legs, ankles, or knees. Small prepatellar fluid collection on R knee, no knee effusions or instability.  No warmth of knees or ankles.  No skin changes.        Assessment & Plan:

## 2013-07-17 NOTE — Patient Instructions (Addendum)
It was a pleasure to see you today.   For the swelling and soreness in the legs, I recommend using compression stockings during the day. Also, I would like to hold your amlodipine (Norvasc) from now until when I see you next in 2 weeks.   Please call me if this is not improving, or if worsening before your next visit.   PLEASE SCHEDULE WITH DR Mauricio Po FOR FOLLOW UP IN 2 WEEKS.

## 2013-07-21 ENCOUNTER — Encounter: Payer: Self-pay | Admitting: Home Health Services

## 2013-07-28 ENCOUNTER — Encounter: Payer: Self-pay | Admitting: Family Medicine

## 2013-07-28 ENCOUNTER — Ambulatory Visit (INDEPENDENT_AMBULATORY_CARE_PROVIDER_SITE_OTHER): Payer: Medicare Other | Admitting: Family Medicine

## 2013-07-28 VITALS — BP 144/70 | HR 58 | Ht 70.0 in | Wt 166.5 lb

## 2013-07-28 DIAGNOSIS — Z23 Encounter for immunization: Secondary | ICD-10-CM

## 2013-07-28 DIAGNOSIS — R609 Edema, unspecified: Secondary | ICD-10-CM

## 2013-07-28 DIAGNOSIS — I10 Essential (primary) hypertension: Secondary | ICD-10-CM

## 2013-07-28 DIAGNOSIS — R600 Localized edema: Secondary | ICD-10-CM

## 2013-07-28 DIAGNOSIS — M25561 Pain in right knee: Secondary | ICD-10-CM

## 2013-07-28 DIAGNOSIS — M25569 Pain in unspecified knee: Secondary | ICD-10-CM

## 2013-07-28 MED ORDER — TRAMADOL HCL 50 MG PO TABS: ORAL_TABLET | ORAL | Status: DC

## 2013-07-28 MED ORDER — AMLODIPINE BESYLATE 5 MG PO TABS: 2.5000 mg | ORAL_TABLET | Freq: Every day | ORAL | Status: AC

## 2013-07-28 NOTE — Assessment & Plan Note (Signed)
To resume amlodipine at lower dose than previous (lower ext edema appears better since holding this med).  To follow BP with this reduced dose.

## 2013-07-28 NOTE — Assessment & Plan Note (Signed)
Improved since holding amlodipine; since his BP remains elevated, will resume Norvasc at lower dose (dose response relationship for LE edema).  Was taking 7.5mg /day, to start back with 2.5mg /day for now and recheck BP.

## 2013-07-28 NOTE — Patient Instructions (Addendum)
It was a pleasure to see you again.  The swelling is a little better today.  We will go back on the Norvasc 5mg  tablets, TAKE 1/2 TABLET ONE TIME DAILY. I sent a new prescription for this and we will fax in the Tramadol as well.  I would like to see you again in 2 months or sooner if needed.

## 2013-07-28 NOTE — Progress Notes (Signed)
  Subjective:    Patient ID: Scott Holmes, male    DOB: 1932-04-21, 79 y.o.   MRN: [id number]  HPI Here for follow up of blood pressure and lower extremity swelling.  He has been holding his Norvasc for 2 weeks and has noticed an improvement in the swelling.  Was not able to tolerate the Tedds stockings.    The tramadol has been helping his leg pain.    Has daily episodes of morning nausea with vomiting; usually feels better afterward.  Reiterates with me today that he does not wish to undergo hemodialysis for his CKD.  Says he has had this discussion with his Nephrologist as well.  Would not want to be hospitalized.  States that his healthcare POA will be his son Scott Holmes, (h) [phone]; (cell) [phone]. Says he has had this conversation with his son.  Is open to the idea of palliative care/hospice at the proper time, states "it's not time yet".   Review of Systems     Objective:   Physical Exam Well appearing, no apparent distress. Sallow.  HEENT Neck supple.  COR Regular S1S2 PULM Clear bilaterally.  Lower EXTS: 1-2+ bilateral pitting edema that is mildly worse in R than L leg. No cords, no tenderness. Palpable dp pulse.       Assessment & Plan:

## 2013-07-29 ENCOUNTER — Ambulatory Visit (INDEPENDENT_AMBULATORY_CARE_PROVIDER_SITE_OTHER): Payer: Medicare Other | Admitting: Sports Medicine

## 2013-07-29 ENCOUNTER — Encounter (HOSPITAL_COMMUNITY)
Admission: RE | Admit: 2013-07-29 | Discharge: 2013-07-29 | Disposition: A | Discharge status: Home / Self Care | Payer: Medicare Other | Source: Ambulatory Visit | Attending: Nephrology | Admitting: Nephrology

## 2013-07-29 ENCOUNTER — Encounter: Payer: Self-pay | Admitting: Sports Medicine

## 2013-07-29 VITALS — BP 155/66 | HR 60 | Ht 70.0 in | Wt 166.0 lb

## 2013-07-29 DIAGNOSIS — M25569 Pain in unspecified knee: Secondary | ICD-10-CM

## 2013-07-29 DIAGNOSIS — IMO0002 Reserved for concepts with insufficient information to code with codable children: Secondary | ICD-10-CM

## 2013-07-29 DIAGNOSIS — M1710 Unilateral primary osteoarthritis, unspecified knee: Secondary | ICD-10-CM

## 2013-07-29 LAB — IRON AND TIBC
Iron: 58 ug/dL (ref 42–135)
TIBC: 265 ug/dL (ref 215–435)
UIBC: 207 ug/dL (ref 125–400)

## 2013-07-29 LAB — FERRITIN: Ferritin: 101 ng/mL (ref 22–322)

## 2013-07-29 LAB — POCT HEMOGLOBIN-HEMACUE: Hemoglobin: 10.7 g/dL — ABNORMAL LOW (ref 13.0–17.0)

## 2013-07-29 MED ORDER — EPOETIN ALFA 10000 UNIT/ML IJ SOLN: 12000.0000 [IU] | INTRAMUSCULAR | Status: DC

## 2013-07-29 MED ORDER — EPOETIN ALFA 20000 UNIT/ML IJ SOLN
INTRAMUSCULAR | Status: AC
  Administered 2013-07-29: 12000 [IU] via SUBCUTANEOUS
  Filled 2013-07-29: qty 1

## 2013-07-29 NOTE — Assessment & Plan Note (Signed)
I think we can reduce his fall risk by using a knee compression sleeve on the right  This will increase his swelling in his ankles so I will only want him to use it when he is standing or walking

## 2013-07-29 NOTE — Assessment & Plan Note (Signed)
Whether his pain radiates because of his significant arthritis or because of other factors such as his swelling I think he needs to continue on a maintenance dose of tramadol so that he is more functional  I will see him in 2-3 months These visit seen to be emotional support for him as he deals with his chronic illnesses

## 2013-07-29 NOTE — Progress Notes (Signed)
  Subjective:    Patient ID: Scott Holmes, male    DOB: 18-Feb-1932, 79 y.o.   MRN: [id number]  HPI 79 yo with hx of stage 4 ckd presents for 1 month f/u regarding right lateral lower leg pain  Pt taking 1/2 tramadol in the am, 1 full in the evening Pt feels like his pain has improved significantly No additional swelling No radiation from the back Patient states that its hard to get up and down stairs Reports feeling a little unsteady when he step up or down with his right kneee   Review of Systems Neg per HPI    Objective:   Physical Exam General: pleasant gentleman in no acute distress with some skin pigment bronzing R leg: No rash, mild pedal edema, no evidence of atrophy Non tender to palpation Strength: hip extension/flexion; knee extension/flexion, plantar/dorsiflexion 5/5  Gait: Moderate right knee lateral subluxation with planting He has a rapid varus thrust        Assessment & Plan:  Leg pain: etiology unclear. Pt feels like he's improved significantly. - cont tramadol as needed  Knee pain and unstable gait/ known DJD - will fit a body helix knee brace for patient to improve stability  - will order and inform patient once arrived - f/u in 2 months  [place], PGY-3

## 2013-07-31 ENCOUNTER — Other Ambulatory Visit: Payer: Self-pay | Admitting: Family Medicine

## 2013-08-12 ENCOUNTER — Encounter (HOSPITAL_COMMUNITY)
Admission: RE | Admit: 2013-08-12 | Discharge: 2013-08-12 | Disposition: A | Discharge status: Home / Self Care | Payer: Medicare Other | Source: Ambulatory Visit | Attending: Nephrology | Admitting: Nephrology

## 2013-08-12 DIAGNOSIS — N184 Chronic kidney disease, stage 4 (severe): Secondary | ICD-10-CM | POA: Insufficient documentation

## 2013-08-12 DIAGNOSIS — D638 Anemia in other chronic diseases classified elsewhere: Secondary | ICD-10-CM | POA: Insufficient documentation

## 2013-08-12 LAB — COMPREHENSIVE METABOLIC PANEL
AST: 15 U/L (ref 0–37)
Albumin: 3.8 g/dL (ref 3.5–5.2)
CO2: 28 mEq/L (ref 19–32)
Calcium: 8.7 mg/dL (ref 8.4–10.5)
Chloride: 99 mEq/L (ref 96–112)
Creatinine, Ser: 6.86 mg/dL — ABNORMAL HIGH (ref 0.50–1.35)
GFR calc Af Amer: 8 mL/min — ABNORMAL LOW (ref >90)
Total Bilirubin: 0.2 mg/dL — ABNORMAL LOW (ref 0.3–1.2)
Total Protein: 7.6 g/dL (ref 6.0–8.3)

## 2013-08-12 LAB — URIC ACID: Uric Acid, Serum: 6.4 mg/dL (ref 4.0–7.8)

## 2013-08-12 LAB — POCT HEMOGLOBIN-HEMACUE: Hemoglobin: 10.8 g/dL — ABNORMAL LOW (ref 13.0–17.0)

## 2013-08-12 MED ORDER — EPOETIN ALFA 2000 UNIT/ML IJ SOLN
INTRAMUSCULAR | Status: AC
  Administered 2013-08-12: 09:00:00 2000 [IU] via SUBCUTANEOUS
  Filled 2013-08-12: qty 1

## 2013-08-12 MED ORDER — EPOETIN ALFA 10000 UNIT/ML IJ SOLN
INTRAMUSCULAR | Status: AC
  Administered 2013-08-12: 10000 [IU] via SUBCUTANEOUS
  Filled 2013-08-12: qty 1

## 2013-08-12 MED ORDER — EPOETIN ALFA 10000 UNIT/ML IJ SOLN: 12000.0000 [IU] | INTRAMUSCULAR | Status: DC

## 2013-08-26 ENCOUNTER — Encounter (HOSPITAL_COMMUNITY)
Admission: RE | Admit: 2013-08-26 | Discharge: 2013-08-26 | Disposition: A | Discharge status: Home / Self Care | Payer: Medicare Other | Source: Ambulatory Visit | Attending: Nephrology | Admitting: Nephrology

## 2013-08-26 LAB — POCT HEMOGLOBIN-HEMACUE: Hemoglobin: 11.1 g/dL — ABNORMAL LOW (ref 13.0–17.0)

## 2013-08-26 LAB — IRON AND TIBC
Iron: 54 ug/dL (ref 42–135)
Saturation Ratios: 22 % (ref 20–55)
TIBC: 248 ug/dL (ref 215–435)

## 2013-08-26 MED ORDER — EPOETIN ALFA 2000 UNIT/ML IJ SOLN
INTRAMUSCULAR | Status: AC
  Administered 2013-08-26: 2000 [IU] via SUBCUTANEOUS
  Filled 2013-08-26: qty 1

## 2013-08-26 MED ORDER — EPOETIN ALFA 10000 UNIT/ML IJ SOLN: 12000.0000 [IU] | INTRAMUSCULAR | Status: DC

## 2013-08-26 MED ORDER — EPOETIN ALFA 10000 UNIT/ML IJ SOLN
INTRAMUSCULAR | Status: AC
  Administered 2013-08-26: 10000 [IU] via SUBCUTANEOUS
  Filled 2013-08-26: qty 1

## 2013-09-04 ENCOUNTER — Other Ambulatory Visit: Payer: Self-pay | Admitting: Family Medicine

## 2013-09-09 ENCOUNTER — Encounter: Payer: Self-pay | Admitting: Home Health Services

## 2013-09-09 ENCOUNTER — Ambulatory Visit (INDEPENDENT_AMBULATORY_CARE_PROVIDER_SITE_OTHER): Payer: Medicare Other | Admitting: Family Medicine

## 2013-09-09 ENCOUNTER — Encounter (HOSPITAL_COMMUNITY)
Admission: RE | Admit: 2013-09-09 | Discharge: 2013-09-09 | Disposition: A | Discharge status: Home / Self Care | Payer: Medicare Other | Source: Ambulatory Visit | Attending: Nephrology | Admitting: Nephrology

## 2013-09-09 VITALS — BP 136/73 | HR 62 | Temp 96.9°F | Ht 70.0 in | Wt 162.7 lb

## 2013-09-09 DIAGNOSIS — D638 Anemia in other chronic diseases classified elsewhere: Secondary | ICD-10-CM | POA: Insufficient documentation

## 2013-09-09 DIAGNOSIS — Z0000 Encounter for general adult medical examination without abnormal findings: Secondary | ICD-10-CM

## 2013-09-09 DIAGNOSIS — N184 Chronic kidney disease, stage 4 (severe): Secondary | ICD-10-CM | POA: Insufficient documentation

## 2013-09-09 MED ORDER — EPOETIN ALFA 10000 UNIT/ML IJ SOLN
INTRAMUSCULAR | Status: AC
  Administered 2013-09-09: 09:00:00 10000 [IU] via SUBCUTANEOUS
  Filled 2013-09-09: qty 1

## 2013-09-09 MED ORDER — EPOETIN ALFA 20000 UNIT/ML IJ SOLN
INTRAMUSCULAR | Status: AC
  Filled 2013-09-09: qty 1

## 2013-09-09 MED ORDER — EPOETIN ALFA 2000 UNIT/ML IJ SOLN
INTRAMUSCULAR | Status: AC
  Administered 2013-09-09: 09:00:00 2000 [IU] via SUBCUTANEOUS
  Filled 2013-09-09: qty 1

## 2013-09-09 MED ORDER — EPOETIN ALFA 10000 UNIT/ML IJ SOLN: 12000.0000 [IU] | INTRAMUSCULAR | Status: DC

## 2013-09-09 NOTE — Progress Notes (Signed)
    Patient here for annual wellness visit, patient reports: Risk Factors/Conditions needing evaluation or treatment: Pt reports falling several times over the past year.  Pt reports no injuries from fall. Home Safety: Pt live by self in 1 story home.  Pt reports having smoke detectors and adaptive equipment in bathroom. Other Information: Corrective lens: Pt reports having reading glass and hasn't had an eye exam in the pat 2 years.  Pt reports having some sort of problem and will schedule eye exam at [facility] soon. Dentures: Pt has full dentures. Memory: Pt denies any memory problems. Patient's Mini Mental Score (recorded in doc. flowsheet): 30  Balance/Gait:   Balance Abnormal Patient value  Sitting balance    Sit to stand x   Attempts to arise    Immediate standing balance    Standing balance    Nudge    Eyes closed- Romberg    Tandem stance    Back lean    Neck Rotation    360 degree turn    Sitting down     Gait Abnormal Patient value  Initiation of gait    Step length-left x   Step length-right x   Step height-left x   Step height-right x   Step symmetry x   Step continuity x   Path deviation    Trunk movement    Walking stance x       Annual Wellness Visit Requirements Recorded Today In  Medical, family, social history Past Medical, Family, Social History Section  Current providers Care team  Current medications Medications  Wt, BP, Ht, BMI Vital signs  Tobacco, alcohol, illicit drug use History  ADL Nurse Assessment  Depression Screening Nurse Assessment  Cognitive impairment Nurse Assessment  Mini Mental Status Document Flowsheet  Fall Risk Fall/Depression  Home Safety Progress Note  End of Life Planning (welcome visit) Social Documentation  Medicare preventative services Progress Note  Risk factors/conditions needing evaluation/treatment Progress Note  Personalized health advice Patient Instructions, goals, letter  Diet & Exercise Social  Documentation  Emergency Contact Social Documentation  Seat Belts Social Documentation  Sun exposure/protection Social Documentation

## 2013-09-11 ENCOUNTER — Other Ambulatory Visit: Payer: Self-pay | Admitting: Family Medicine

## 2013-09-22 ENCOUNTER — Other Ambulatory Visit (HOSPITAL_COMMUNITY): Payer: Self-pay | Admitting: *Deleted

## 2013-09-23 ENCOUNTER — Ambulatory Visit (INDEPENDENT_AMBULATORY_CARE_PROVIDER_SITE_OTHER): Payer: Medicare Other | Admitting: Sports Medicine

## 2013-09-23 ENCOUNTER — Encounter (HOSPITAL_COMMUNITY)
Admission: RE | Admit: 2013-09-23 | Discharge: 2013-09-23 | Disposition: A | Discharge status: Home / Self Care | Payer: Medicare Other | Source: Ambulatory Visit | Attending: Nephrology | Admitting: Nephrology

## 2013-09-23 VITALS — BP 171/79 | Ht 70.0 in | Wt 162.0 lb

## 2013-09-23 DIAGNOSIS — Z9181 History of falling: Secondary | ICD-10-CM | POA: Insufficient documentation

## 2013-09-23 DIAGNOSIS — R609 Edema, unspecified: Secondary | ICD-10-CM

## 2013-09-23 DIAGNOSIS — R600 Localized edema: Secondary | ICD-10-CM

## 2013-09-23 DIAGNOSIS — M25569 Pain in unspecified knee: Secondary | ICD-10-CM

## 2013-09-23 DIAGNOSIS — Z789 Other specified health status: Secondary | ICD-10-CM

## 2013-09-23 DIAGNOSIS — M1710 Unilateral primary osteoarthritis, unspecified knee: Secondary | ICD-10-CM

## 2013-09-23 LAB — IRON AND TIBC
Saturation Ratios: 33 % (ref 20–55)
UIBC: 174 ug/dL (ref 125–400)

## 2013-09-23 LAB — POCT HEMOGLOBIN-HEMACUE: Hemoglobin: 10.7 g/dL — ABNORMAL LOW (ref 13.0–17.0)

## 2013-09-23 MED ORDER — EPOETIN ALFA 10000 UNIT/ML IJ SOLN: 12000.0000 [IU] | INTRAMUSCULAR | Status: DC

## 2013-09-23 MED ORDER — EPOETIN ALFA 10000 UNIT/ML IJ SOLN
INTRAMUSCULAR | Status: AC
  Administered 2013-09-23: 10000 [IU] via SUBCUTANEOUS
  Filled 2013-09-23: qty 1

## 2013-09-23 MED ORDER — EPOETIN ALFA 2000 UNIT/ML IJ SOLN
INTRAMUSCULAR | Status: AC
  Administered 2013-09-23: 2000 [IU] via SUBCUTANEOUS
  Filled 2013-09-23: qty 1

## 2013-09-23 NOTE — Assessment & Plan Note (Signed)
Edema looks better today  I think this is because he is using his lasix more consistently

## 2013-09-23 NOTE — Assessment & Plan Note (Addendum)
Re-enforced use of cane  Will make sure he keeps up some strength exercise for hip and leg mm These were reviewed today  Laceration steri-stripped and cleaned today

## 2013-09-23 NOTE — Patient Instructions (Signed)
Try Vasocon A or Napthicon A eye drops

## 2013-09-23 NOTE — Assessment & Plan Note (Signed)
Stable  Using his knee sleeve - large size and this helps left feel more steady

## 2013-09-23 NOTE — Assessment & Plan Note (Signed)
Pain level of lower legs much better on total of only 50 mgm tramadol daily 1/2 tab bid and he will continue this

## 2013-09-23 NOTE — Progress Notes (Signed)
Patient ID: Scott Holmes, male   DOB: [birth year], 79 y.o.   MRN: [id number]   Pt presents to clinic for evaluation of bilateral leg pain. Legs continue to feel some better. Taking tramadol 50 mg 1/2 tab in the morning and 1/2 tab at night.    He reports that his kidney function has worsened and Dr. Eliott Nine has referred him to Hospice.  Patient who fell on way to his appt with [place], but not in our parking lot.  Was walking from [facility].   He has had 3 fairly recent falls and is not using cane although he has one in car, at home, etc       Physical Exam:  Bronze skin discoloration NAD but blood on forehead, shirt  1+ swelling noted bilateral lower legs Few minor abrasions on anterior lt leg Dryness but no abrasions on rt leg -5 deg flexion rt knee -3 deg flexion lt knee Strong hip flexion bilat Quad strength good bilat   Bruising on left lateral orbit Linear 1" laceration over left orbit with contusion that looks better with icing Lt sub conjunctival  hemorrhage that was there before he fell

## 2013-09-25 ENCOUNTER — Encounter: Payer: Self-pay | Admitting: Family Medicine

## 2013-09-25 ENCOUNTER — Ambulatory Visit (INDEPENDENT_AMBULATORY_CARE_PROVIDER_SITE_OTHER): Payer: Medicare Other | Admitting: Family Medicine

## 2013-09-25 VITALS — BP 155/73 | HR 68 | Ht 70.0 in | Wt 168.0 lb

## 2013-09-25 DIAGNOSIS — F4321 Adjustment disorder with depressed mood: Secondary | ICD-10-CM

## 2013-09-25 DIAGNOSIS — R040 Epistaxis: Secondary | ICD-10-CM

## 2013-09-25 MED ORDER — SERTRALINE HCL 25 MG PO TABS: 25.0000 mg | ORAL_TABLET | Freq: Every day | ORAL | Status: DC

## 2013-09-25 MED ORDER — SIMVASTATIN 20 MG PO TABS: 20.0000 mg | ORAL_TABLET | Freq: Every day | ORAL | Status: AC

## 2013-09-25 NOTE — Assessment & Plan Note (Signed)
Patient requesting restart of SSRI; denies depression.  Will restart; he declines my offer to involve hospice.  Declines offer for home PT to evaluate for fall risk.  Will follow up in 1 months' time.

## 2013-09-25 NOTE — Progress Notes (Signed)
  Subjective:    Patient ID: Scott Holmes, male    DOB: 1932/01/07, 79 y.o.   MRN: [id number]  HPI Scott Holmes is seen in follow up today.  He states that the reason for his visit is to request a refill of sertraline 25mg  daily, which he used to take after his wife's death, for grief reaction and subsequent depression.  He stopped taking it in December of last year (11 mos ago).  Says he is not truly depressed but the medicine "would help me to be able to talk to people". He saw his nephrologist earlier this month for follow up of his advanced CKD and was told "I have six months to live", was recommended to enroll in hospice.  He states that he still sees hospice counselors regularly for his grief reaction, but states that he does not want to enroll as a hospice patient yet.  He denies feelings of depression or SI.   PHQ9 done today is 1 (#2)   fallwhile walking on sidewalk 11/19 going to [facility].  No syncope or presyncope.  Hit side of L orbit,. No visual changes or HA.   Review of Systems     Objective:   Physical Exam Ecchymosis around L eye.  Subconjunctival hemorrhage OS.  Neck supple. EOMI.  COR regular S1S2 EXTS 1+ pitting edema.        Assessment & Plan:

## 2013-09-25 NOTE — Progress Notes (Signed)
Patient ID: Scott Holmes, male   DOB: 02-Apr-1932, 79 y.o.   MRN: [id number]  Subjective  HPI:  Mr. Mestre presents today to discuss restarting sertraline.  He says that it helps him to cope and deal with people.  He recently was told that due to his end stage renal disease he has about 6 months left to live.  He says that he has come to terms with this and is ready to die, however he denies any suicidal ideation.

## 2013-09-25 NOTE — Patient Instructions (Addendum)
For the redness of the eye (subconjunctival hemorrhage), try Vasocon A or Napthicon A eye drops.   Try to use Aveeno moisturizer on your feet and legs instead of using the temovate (clobetasol) at bedtime.  Use the clobetasol only when the dryness and itching is really bad, and then only for 10 consecutive days at a time.  I would like to see you back in one month, but it would be fine to make our follow up after the first of January 2015.

## 2013-10-07 ENCOUNTER — Encounter (HOSPITAL_COMMUNITY)
Admission: RE | Admit: 2013-10-07 | Discharge: 2013-10-07 | Disposition: A | Discharge status: Home / Self Care | Payer: Medicare Other | Source: Ambulatory Visit | Attending: Nephrology | Admitting: Nephrology

## 2013-10-07 DIAGNOSIS — N184 Chronic kidney disease, stage 4 (severe): Secondary | ICD-10-CM | POA: Insufficient documentation

## 2013-10-07 DIAGNOSIS — D638 Anemia in other chronic diseases classified elsewhere: Secondary | ICD-10-CM | POA: Insufficient documentation

## 2013-10-07 LAB — POCT HEMOGLOBIN-HEMACUE: Hemoglobin: 9.6 g/dL — ABNORMAL LOW (ref 13.0–17.0)

## 2013-10-07 MED ORDER — EPOETIN ALFA 2000 UNIT/ML IJ SOLN
INTRAMUSCULAR | Status: AC
  Administered 2013-10-07: 2000 [IU] via SUBCUTANEOUS
  Filled 2013-10-07: qty 1

## 2013-10-07 MED ORDER — EPOETIN ALFA 10000 UNIT/ML IJ SOLN: 12000.0000 [IU] | INTRAMUSCULAR | Status: DC

## 2013-10-07 MED ORDER — EPOETIN ALFA 10000 UNIT/ML IJ SOLN
INTRAMUSCULAR | Status: AC
  Administered 2013-10-07: 09:00:00 10000 [IU] via SUBCUTANEOUS
  Filled 2013-10-07: qty 1

## 2013-10-20 ENCOUNTER — Other Ambulatory Visit (HOSPITAL_COMMUNITY): Payer: Self-pay | Admitting: *Deleted

## 2013-10-21 ENCOUNTER — Encounter (HOSPITAL_COMMUNITY)
Admission: RE | Admit: 2013-10-21 | Discharge: 2013-10-21 | Disposition: A | Discharge status: Home / Self Care | Payer: Medicare Other | Source: Ambulatory Visit | Attending: Nephrology | Admitting: Nephrology

## 2013-10-21 LAB — POCT HEMOGLOBIN-HEMACUE: Hemoglobin: 10.6 g/dL — ABNORMAL LOW (ref 13.0–17.0)

## 2013-10-21 LAB — IRON AND TIBC: UIBC: 215 ug/dL (ref 125–400)

## 2013-10-21 MED ORDER — EPOETIN ALFA 10000 UNIT/ML IJ SOLN
15000.0000 [IU] | INTRAMUSCULAR | Status: DC
  Administered 2013-10-21: 09:00:00 15000 [IU] via SUBCUTANEOUS

## 2013-11-04 ENCOUNTER — Encounter (HOSPITAL_COMMUNITY)
Admission: RE | Admit: 2013-11-04 | Discharge: 2013-11-04 | Disposition: A | Discharge status: Home / Self Care | Payer: Medicare Other | Source: Ambulatory Visit | Attending: Nephrology | Admitting: Nephrology

## 2013-11-04 DIAGNOSIS — D638 Anemia in other chronic diseases classified elsewhere: Secondary | ICD-10-CM | POA: Insufficient documentation

## 2013-11-04 DIAGNOSIS — M109 Gout, unspecified: Secondary | ICD-10-CM | POA: Insufficient documentation

## 2013-11-04 DIAGNOSIS — N184 Chronic kidney disease, stage 4 (severe): Secondary | ICD-10-CM | POA: Insufficient documentation

## 2013-11-04 LAB — POCT HEMOGLOBIN-HEMACUE: Hemoglobin: 10.2 g/dL — ABNORMAL LOW (ref 13.0–17.0)

## 2013-11-04 MED ORDER — EPOETIN ALFA 20000 UNIT/ML IJ SOLN
INTRAMUSCULAR | Status: AC
  Administered 2013-11-04: 09:00:00 15000 [IU] via SUBCUTANEOUS
  Filled 2013-11-04: qty 1

## 2013-11-04 MED ORDER — EPOETIN ALFA 10000 UNIT/ML IJ SOLN: 15000.0000 [IU] | INTRAMUSCULAR | Status: DC

## 2013-11-18 ENCOUNTER — Encounter (HOSPITAL_COMMUNITY)
Admission: RE | Admit: 2013-11-18 | Discharge: 2013-11-18 | Disposition: A | Discharge status: Home / Self Care | Payer: Medicare Other | Source: Ambulatory Visit | Attending: Nephrology | Admitting: Nephrology

## 2013-11-18 DIAGNOSIS — N184 Chronic kidney disease, stage 4 (severe): Secondary | ICD-10-CM | POA: Insufficient documentation

## 2013-11-18 DIAGNOSIS — D638 Anemia in other chronic diseases classified elsewhere: Secondary | ICD-10-CM | POA: Insufficient documentation

## 2013-11-18 LAB — COMPREHENSIVE METABOLIC PANEL
ALT: 13 U/L (ref 0–53)
AST: 17 U/L (ref 0–37)
Albumin: 3.9 g/dL (ref 3.5–5.2)
Alkaline Phosphatase: 66 U/L (ref 39–117)
BUN: 88 mg/dL — ABNORMAL HIGH (ref 6–23)
CHLORIDE: 99 meq/L (ref 96–112)
CO2: 24 mEq/L (ref 19–32)
Calcium: 8.9 mg/dL (ref 8.4–10.5)
Creatinine, Ser: 7 mg/dL — ABNORMAL HIGH (ref 0.50–1.35)
GFR calc Af Amer: 8 mL/min — ABNORMAL LOW (ref >90)
GFR calc non Af Amer: 7 mL/min — ABNORMAL LOW (ref >90)
GLUCOSE: 121 mg/dL — AB (ref 70–99)
POTASSIUM: 3.9 meq/L (ref 3.7–5.3)
SODIUM: 143 meq/L (ref 137–147)
TOTAL PROTEIN: 8 g/dL (ref 6.0–8.3)
Total Bilirubin: 0.3 mg/dL (ref 0.3–1.2)

## 2013-11-18 LAB — PHOSPHORUS: Phosphorus: 6 mg/dL — ABNORMAL HIGH (ref 2.3–4.6)

## 2013-11-18 LAB — IRON AND TIBC
Iron: 81 ug/dL (ref 42–135)
Saturation Ratios: 27 % (ref 20–55)
TIBC: 297 ug/dL (ref 215–435)
UIBC: 216 ug/dL (ref 125–400)

## 2013-11-18 LAB — POCT HEMOGLOBIN-HEMACUE: Hemoglobin: 10.8 g/dL — ABNORMAL LOW (ref 13.0–17.0)

## 2013-11-18 LAB — URIC ACID: Uric Acid, Serum: 6.6 mg/dL (ref 4.0–7.8)

## 2013-11-18 LAB — FERRITIN: Ferritin: 62 ng/mL (ref 22–322)

## 2013-11-18 MED ORDER — EPOETIN ALFA 20000 UNIT/ML IJ SOLN
INTRAMUSCULAR | Status: AC
  Administered 2013-11-18: 09:00:00 15000 [IU] via SUBCUTANEOUS
  Filled 2013-11-18: qty 1

## 2013-11-18 MED ORDER — EPOETIN ALFA 10000 UNIT/ML IJ SOLN: 15000.0000 [IU] | INTRAMUSCULAR | Status: DC

## 2013-11-19 LAB — PTH, INTACT AND CALCIUM
Calcium, Total (PTH): 8.5 mg/dL (ref 8.4–10.5)
PTH: 210.9 pg/mL — AB (ref 14.0–72.0)

## 2013-11-20 ENCOUNTER — Encounter: Payer: Self-pay | Admitting: Family Medicine

## 2013-11-20 ENCOUNTER — Ambulatory Visit (INDEPENDENT_AMBULATORY_CARE_PROVIDER_SITE_OTHER): Payer: Medicare Other | Admitting: Family Medicine

## 2013-11-20 VITALS — BP 138/70 | HR 65 | Temp 97.5°F | Ht 70.0 in | Wt 171.0 lb

## 2013-11-20 DIAGNOSIS — R040 Epistaxis: Secondary | ICD-10-CM

## 2013-11-20 MED ORDER — LEVOCETIRIZINE DIHYDROCHLORIDE 5 MG PO TABS: 5.0000 mg | ORAL_TABLET | Freq: Every evening | ORAL | Status: AC

## 2013-11-20 NOTE — Assessment & Plan Note (Signed)
Recurrent (nightly) epistaxis from both nares, R more than L, for 2 months (visit date today Nov 20, 2013). For ENT evaluation given recurrence and visible evidence of signficant crusted blood in both nares. Concern for posterior source of bleeding. Not on anticoagulation, has stopped his aspirin already.  Discussed measures to stop bleeding; may use Afrin for acute bleeding.

## 2013-11-20 NOTE — Progress Notes (Signed)
   Subjective:    Patient ID: Scott Holmes, male    DOB: [birth year], 80 y.o.   MRN: [id number]  HPI Patient presents today for complaint of epistaxis occurring every night when he lays down, has to get up 3 to 4 times a night and tamponade his nose externally.  Blood from both nares, usually more from R than L. Coughs up fair amount of fresh blood, which chokes him when he lays down.  Vaseline, humidifier at night may help a little.  Has been occurring for the past 2 months.  Has been holding his aspirin recently due to the bleeding. Not on any anticoagulation at the present time.   No known history of bleeding diathesis.  Not bleeding from anywhere else (no melena, no hematuria, no bright red blood per rectum).    Reviewed recent labs, Hgb on 11/19/13 was 10.8 (in keeping with his usual Hgb on checks with renal service). Review of Systems No shortness of breath, no chest pain or pressure.   Social Hx; Nonsmoker; no alcohol.     Objective:   Physical Exam Alert, sallow but well appearing, no apparent distress HEENT Neck supple. No cervical adenopathy. MMM. Crusted blood in both nasal vaults. No frontal or maxillary sinus tenderness. No purulence on inspection of nasal mucosa. Tms clear bilaterally. Clear oropharynx. COR regular S1S2, no extra sounds PULM Clear bilaterally, no rales or wheezes.       Assessment & Plan:

## 2013-11-20 NOTE — Patient Instructions (Signed)
It was a pleasure to see you today.   Regarding your nosebleeds, I believe the source of the bleeding may be further back than can be seen by me in the office.  I am requesting a referral with the Ear-Nose-Throat doctor to evaluate further.   Remember what we talked about regarding ways to stop the bleeding at home.  Nosebleed Nosebleeds can be caused by many conditions including trauma, infections, polyps, foreign bodies, dry mucous membranes or climate, medications and air conditioning. Most nosebleeds occur in the front of the nose. It is because of this location that most nosebleeds can be controlled by pinching the nostrils gently and continuously. Do this for at least 10 to 20 minutes. The reason for this long continuous pressure is that you must hold it long enough for the blood to clot. If during that 10 to 20 minute time period, pressure is released, the process may have to be started again. The nosebleed may stop by itself, quit with pressure, need concentrated heating (cautery) or stop with pressure from packing. HOME CARE INSTRUCTIONS   If your nose was packed, try to maintain the pack inside until your caregiver removes it. If a gauze pack was used and it starts to fall out, gently replace or cut the end off. Do not cut if a balloon catheter was used to pack the nose. Otherwise, do not remove unless instructed.  Avoid blowing your nose for 12 hours after treatment. This could dislodge the pack or clot and start bleeding again.  If the bleeding starts again, sit up and bending forward, gently pinch the front half of your nose continuously for 20 minutes.  If bleeding was caused by dry mucous membranes, cover the inside of your nose every morning with a petroleum or antibiotic ointment. Use your little fingertip as an applicator. Do this as needed during dry weather. This will keep the mucous membranes moist and allow them to heal.  Maintain humidity in your home by using less air  conditioning or using a humidifier.  Do not use aspirin or medications which make bleeding more likely. Your caregiver can give you recommendations on this.  Resume normal activities as able but try to avoid straining, lifting or bending at the waist for several days.  If the nosebleeds become recurrent and the cause is unknown, your caregiver may suggest laboratory tests. SEEK IMMEDIATE MEDICAL CARE IF:   Bleeding recurs and cannot be controlled.  There is unusual bleeding from or bruising on other parts of the body.  You have a fever.  Nosebleeds continue.  There is any worsening of the condition which originally brought you in.  You become lightheaded, feel faint, become sweaty or vomit blood. MAKE SURE YOU:   Understand these instructions.  Will watch your condition.  Will get help right away if you are not doing well or get worse. Document Released: 08/01/2005 Document Revised: 01/14/2012 Document Reviewed: 09/23/2009 [facility] Patient Information 2014 [place], [place].

## 2013-12-02 ENCOUNTER — Encounter (HOSPITAL_COMMUNITY)
Admission: RE | Admit: 2013-12-02 | Discharge: 2013-12-02 | Disposition: A | Discharge status: Home / Self Care | Payer: Medicare Other | Source: Ambulatory Visit | Attending: Nephrology | Admitting: Nephrology

## 2013-12-02 LAB — POCT HEMOGLOBIN-HEMACUE: Hemoglobin: 11.4 g/dL — ABNORMAL LOW (ref 13.0–17.0)

## 2013-12-02 MED ORDER — EPOETIN ALFA 10000 UNIT/ML IJ SOLN
15000.0000 [IU] | INTRAMUSCULAR | Status: DC
  Administered 2013-12-02: 15000 [IU] via SUBCUTANEOUS

## 2013-12-02 MED ORDER — EPOETIN ALFA 20000 UNIT/ML IJ SOLN
INTRAMUSCULAR | Status: AC
  Filled 2013-12-02: qty 1

## 2013-12-03 MED FILL — Epoetin Alfa Inj 20000 Unit/ML: INTRAMUSCULAR | Qty: 1 | Status: AC

## 2013-12-16 ENCOUNTER — Encounter (HOSPITAL_COMMUNITY)
Admission: RE | Admit: 2013-12-16 | Discharge: 2013-12-16 | Disposition: A | Discharge status: Home / Self Care | Payer: Medicare Other | Source: Ambulatory Visit | Attending: Nephrology | Admitting: Nephrology

## 2013-12-16 DIAGNOSIS — N184 Chronic kidney disease, stage 4 (severe): Secondary | ICD-10-CM | POA: Insufficient documentation

## 2013-12-16 DIAGNOSIS — D638 Anemia in other chronic diseases classified elsewhere: Secondary | ICD-10-CM | POA: Insufficient documentation

## 2013-12-16 LAB — POCT HEMOGLOBIN-HEMACUE: Hemoglobin: 10.9 g/dL — ABNORMAL LOW (ref 13.0–17.0)

## 2013-12-16 LAB — IRON AND TIBC
IRON: 69 ug/dL (ref 42–135)
Saturation Ratios: 24 % (ref 20–55)
TIBC: 291 ug/dL (ref 215–435)
UIBC: 222 ug/dL (ref 125–400)

## 2013-12-16 LAB — FERRITIN: FERRITIN: 54 ng/mL (ref 22–322)

## 2013-12-16 MED ORDER — EPOETIN ALFA 10000 UNIT/ML IJ SOLN: 15000.0000 [IU] | INTRAMUSCULAR | Status: DC

## 2013-12-16 MED ORDER — EPOETIN ALFA 20000 UNIT/ML IJ SOLN
INTRAMUSCULAR | Status: AC
Start: 2013-12-16 — End: 2013-12-16
  Administered 2013-12-16: 15000 [IU] via SUBCUTANEOUS
  Filled 2013-12-16: qty 1

## 2013-12-30 ENCOUNTER — Encounter (HOSPITAL_COMMUNITY)
Admission: RE | Admit: 2013-12-30 | Discharge: 2013-12-30 | Disposition: A | Discharge status: Home / Self Care | Payer: Medicare Other | Source: Ambulatory Visit | Attending: Nephrology | Admitting: Nephrology

## 2013-12-30 LAB — POCT HEMOGLOBIN-HEMACUE: Hemoglobin: 10.4 g/dL — ABNORMAL LOW (ref 13.0–17.0)

## 2013-12-30 MED ORDER — EPOETIN ALFA 20000 UNIT/ML IJ SOLN
INTRAMUSCULAR | Status: AC
  Administered 2013-12-30: 15000 [IU] via SUBCUTANEOUS
  Filled 2013-12-30: qty 1

## 2013-12-30 MED ORDER — EPOETIN ALFA 10000 UNIT/ML IJ SOLN: 15000.0000 [IU] | INTRAMUSCULAR | Status: DC

## 2014-01-04 ENCOUNTER — Encounter: Payer: Self-pay | Admitting: Home Health Services

## 2014-01-04 NOTE — Telephone Encounter (Signed)
Opened in error

## 2014-01-13 ENCOUNTER — Encounter (HOSPITAL_COMMUNITY)
Admission: RE | Admit: 2014-01-13 | Discharge: 2014-01-13 | Disposition: A | Discharge status: Home / Self Care | Payer: Medicare Other | Source: Ambulatory Visit | Attending: Nephrology | Admitting: Nephrology

## 2014-01-13 DIAGNOSIS — D638 Anemia in other chronic diseases classified elsewhere: Secondary | ICD-10-CM | POA: Insufficient documentation

## 2014-01-13 DIAGNOSIS — N184 Chronic kidney disease, stage 4 (severe): Secondary | ICD-10-CM | POA: Insufficient documentation

## 2014-01-13 LAB — IRON AND TIBC
IRON: 49 ug/dL (ref 42–135)
Saturation Ratios: 18 % — ABNORMAL LOW (ref 20–55)
TIBC: 277 ug/dL (ref 215–435)
UIBC: 228 ug/dL (ref 125–400)

## 2014-01-13 LAB — FERRITIN: FERRITIN: 67 ng/mL (ref 22–322)

## 2014-01-13 LAB — POCT HEMOGLOBIN-HEMACUE: HEMOGLOBIN: 10.6 g/dL — AB (ref 13.0–17.0)

## 2014-01-13 MED ORDER — EPOETIN ALFA 20000 UNIT/ML IJ SOLN
INTRAMUSCULAR | Status: AC
  Administered 2014-01-13: 20000 [IU] via SUBCUTANEOUS
  Filled 2014-01-13: qty 1

## 2014-01-13 MED ORDER — EPOETIN ALFA 10000 UNIT/ML IJ SOLN: 15000.0000 [IU] | INTRAMUSCULAR | Status: DC

## 2014-01-27 ENCOUNTER — Encounter (HOSPITAL_COMMUNITY)
Admission: RE | Admit: 2014-01-27 | Discharge: 2014-01-27 | Disposition: A | Discharge status: Home / Self Care | Payer: Medicare Other | Source: Ambulatory Visit | Attending: Nephrology | Admitting: Nephrology

## 2014-01-27 LAB — POCT HEMOGLOBIN-HEMACUE: HEMOGLOBIN: 11.2 g/dL — AB (ref 13.0–17.0)

## 2014-01-27 MED ORDER — EPOETIN ALFA 10000 UNIT/ML IJ SOLN: 15000.0000 [IU] | INTRAMUSCULAR | Status: DC

## 2014-01-27 MED ORDER — EPOETIN ALFA 20000 UNIT/ML IJ SOLN
INTRAMUSCULAR | Status: AC
  Administered 2014-01-27: 15000 [IU] via SUBCUTANEOUS
  Filled 2014-01-27: qty 1

## 2014-02-10 ENCOUNTER — Encounter (HOSPITAL_COMMUNITY)
Admission: RE | Admit: 2014-02-10 | Discharge: 2014-02-10 | Disposition: A | Discharge status: Home / Self Care | Payer: Medicare Other | Source: Ambulatory Visit | Attending: Nephrology | Admitting: Nephrology

## 2014-02-10 DIAGNOSIS — D638 Anemia in other chronic diseases classified elsewhere: Secondary | ICD-10-CM | POA: Insufficient documentation

## 2014-02-10 DIAGNOSIS — N184 Chronic kidney disease, stage 4 (severe): Secondary | ICD-10-CM | POA: Insufficient documentation

## 2014-02-10 LAB — COMPREHENSIVE METABOLIC PANEL
ALT: 12 U/L (ref 0–53)
AST: 16 U/L (ref 0–37)
Albumin: 3.5 g/dL (ref 3.5–5.2)
Alkaline Phosphatase: 57 U/L (ref 39–117)
BUN: 72 mg/dL — ABNORMAL HIGH (ref 6–23)
CALCIUM: 8.6 mg/dL (ref 8.4–10.5)
CO2: 22 meq/L (ref 19–32)
CREATININE: 5.41 mg/dL — AB (ref 0.50–1.35)
Chloride: 104 mEq/L (ref 96–112)
GFR, EST AFRICAN AMERICAN: 10 mL/min — AB (ref >90)
GFR, EST NON AFRICAN AMERICAN: 9 mL/min — AB (ref >90)
Glucose, Bld: 110 mg/dL — ABNORMAL HIGH (ref 70–99)
Potassium: 4.1 mEq/L (ref 3.7–5.3)
SODIUM: 144 meq/L (ref 137–147)
TOTAL PROTEIN: 7.3 g/dL (ref 6.0–8.3)
Total Bilirubin: 0.3 mg/dL (ref 0.3–1.2)

## 2014-02-10 LAB — PHOSPHORUS: Phosphorus: 5.4 mg/dL — ABNORMAL HIGH (ref 2.3–4.6)

## 2014-02-10 LAB — IRON AND TIBC
IRON: 72 ug/dL (ref 42–135)
SATURATION RATIOS: 26 % (ref 20–55)
TIBC: 275 ug/dL (ref 215–435)
UIBC: 203 ug/dL (ref 125–400)

## 2014-02-10 LAB — URIC ACID: Uric Acid, Serum: 6.6 mg/dL (ref 4.0–7.8)

## 2014-02-10 LAB — FERRITIN: FERRITIN: 73 ng/mL (ref 22–322)

## 2014-02-10 LAB — POCT HEMOGLOBIN-HEMACUE: HEMOGLOBIN: 10.6 g/dL — AB (ref 13.0–17.0)

## 2014-02-10 MED ORDER — EPOETIN ALFA 20000 UNIT/ML IJ SOLN
INTRAMUSCULAR | Status: AC
  Administered 2014-02-10: 15000 [IU] via SUBCUTANEOUS
  Filled 2014-02-10: qty 1

## 2014-02-10 MED ORDER — EPOETIN ALFA 10000 UNIT/ML IJ SOLN: 15000.0000 [IU] | INTRAMUSCULAR | Status: DC

## 2014-02-13 ENCOUNTER — Other Ambulatory Visit: Payer: Self-pay | Admitting: Family Medicine

## 2014-02-24 ENCOUNTER — Encounter (HOSPITAL_COMMUNITY)
Admission: RE | Admit: 2014-02-24 | Discharge: 2014-02-24 | Disposition: A | Discharge status: Home / Self Care | Payer: Medicare Other | Source: Ambulatory Visit | Attending: Nephrology | Admitting: Nephrology

## 2014-02-24 LAB — POCT HEMOGLOBIN-HEMACUE: Hemoglobin: 11.5 g/dL — ABNORMAL LOW (ref 13.0–17.0)

## 2014-02-24 MED ORDER — EPOETIN ALFA 20000 UNIT/ML IJ SOLN
INTRAMUSCULAR | Status: AC
  Administered 2014-02-24: 15000 [IU] via SUBCUTANEOUS
  Filled 2014-02-24: qty 1

## 2014-02-24 MED ORDER — EPOETIN ALFA 10000 UNIT/ML IJ SOLN: 15000.0000 [IU] | INTRAMUSCULAR | Status: DC

## 2014-03-10 ENCOUNTER — Encounter (HOSPITAL_COMMUNITY)
Admission: RE | Admit: 2014-03-10 | Discharge: 2014-03-10 | Disposition: A | Discharge status: Home / Self Care | Payer: Medicare Other | Source: Ambulatory Visit | Attending: Nephrology | Admitting: Nephrology

## 2014-03-10 DIAGNOSIS — D638 Anemia in other chronic diseases classified elsewhere: Secondary | ICD-10-CM | POA: Insufficient documentation

## 2014-03-10 DIAGNOSIS — N184 Chronic kidney disease, stage 4 (severe): Secondary | ICD-10-CM | POA: Insufficient documentation

## 2014-03-10 LAB — IRON AND TIBC
Iron: 74 ug/dL (ref 42–135)
SATURATION RATIOS: 28 % (ref 20–55)
TIBC: 265 ug/dL (ref 215–435)
UIBC: 191 ug/dL (ref 125–400)

## 2014-03-10 LAB — FERRITIN: Ferritin: 72 ng/mL (ref 22–322)

## 2014-03-10 LAB — POCT HEMOGLOBIN-HEMACUE: Hemoglobin: 10.9 g/dL — ABNORMAL LOW (ref 13.0–17.0)

## 2014-03-10 MED ORDER — EPOETIN ALFA 10000 UNIT/ML IJ SOLN: 15000.0000 [IU] | INTRAMUSCULAR | Status: DC

## 2014-03-10 MED ORDER — EPOETIN ALFA 20000 UNIT/ML IJ SOLN
INTRAMUSCULAR | Status: AC
  Administered 2014-03-10: 15000 [IU] via SUBCUTANEOUS
  Filled 2014-03-10: qty 1

## 2014-03-20 ENCOUNTER — Other Ambulatory Visit: Payer: Self-pay | Admitting: Family Medicine

## 2014-03-24 ENCOUNTER — Encounter (HOSPITAL_COMMUNITY)
Admission: RE | Admit: 2014-03-24 | Discharge: 2014-03-24 | Disposition: A | Discharge status: Home / Self Care | Payer: Medicare Other | Source: Ambulatory Visit | Attending: Nephrology | Admitting: Nephrology

## 2014-03-24 LAB — POCT HEMOGLOBIN-HEMACUE: Hemoglobin: 11 g/dL — ABNORMAL LOW (ref 13.0–17.0)

## 2014-03-24 MED ORDER — EPOETIN ALFA 10000 UNIT/ML IJ SOLN: 15000.0000 [IU] | INTRAMUSCULAR | Status: DC

## 2014-03-24 MED ORDER — EPOETIN ALFA 20000 UNIT/ML IJ SOLN
INTRAMUSCULAR | Status: AC
  Administered 2014-03-24: 20000 [IU] via SUBCUTANEOUS
  Filled 2014-03-24: qty 1

## 2014-04-07 ENCOUNTER — Encounter (HOSPITAL_COMMUNITY)
Admission: RE | Admit: 2014-04-07 | Discharge: 2014-04-07 | Disposition: A | Discharge status: Home / Self Care | Payer: Medicare Other | Source: Ambulatory Visit | Attending: Nephrology | Admitting: Nephrology

## 2014-04-07 DIAGNOSIS — N184 Chronic kidney disease, stage 4 (severe): Secondary | ICD-10-CM | POA: Insufficient documentation

## 2014-04-07 DIAGNOSIS — D638 Anemia in other chronic diseases classified elsewhere: Secondary | ICD-10-CM | POA: Insufficient documentation

## 2014-04-07 LAB — IRON AND TIBC
IRON: 73 ug/dL (ref 42–135)
SATURATION RATIOS: 27 % (ref 20–55)
TIBC: 268 ug/dL (ref 215–435)
UIBC: 195 ug/dL (ref 125–400)

## 2014-04-07 LAB — FERRITIN: Ferritin: 61 ng/mL (ref 22–322)

## 2014-04-07 LAB — POCT HEMOGLOBIN-HEMACUE: Hemoglobin: 11.3 g/dL — ABNORMAL LOW (ref 13.0–17.0)

## 2014-04-07 MED ORDER — EPOETIN ALFA 20000 UNIT/ML IJ SOLN
INTRAMUSCULAR | Status: AC
  Administered 2014-04-07: 20000 [IU] via SUBCUTANEOUS
  Filled 2014-04-07: qty 1

## 2014-04-07 MED ORDER — EPOETIN ALFA 10000 UNIT/ML IJ SOLN: 15000.0000 [IU] | INTRAMUSCULAR | Status: DC

## 2014-04-21 ENCOUNTER — Encounter (HOSPITAL_COMMUNITY)
Admission: RE | Admit: 2014-04-21 | Discharge: 2014-04-21 | Disposition: A | Discharge status: Home / Self Care | Payer: Medicare Other | Source: Ambulatory Visit | Attending: Nephrology | Admitting: Nephrology

## 2014-04-21 LAB — POCT HEMOGLOBIN-HEMACUE: HEMOGLOBIN: 11.3 g/dL — AB (ref 13.0–17.0)

## 2014-04-21 MED ORDER — EPOETIN ALFA 10000 UNIT/ML IJ SOLN: 15000.0000 [IU] | INTRAMUSCULAR | Status: DC

## 2014-04-21 MED ORDER — EPOETIN ALFA 20000 UNIT/ML IJ SOLN
INTRAMUSCULAR | Status: AC
  Administered 2014-04-21: 15000 [IU] via SUBCUTANEOUS
  Filled 2014-04-21: qty 1

## 2014-05-05 ENCOUNTER — Encounter (HOSPITAL_COMMUNITY)
Admission: RE | Admit: 2014-05-05 | Discharge: 2014-05-05 | Disposition: A | Discharge status: Home / Self Care | Payer: Medicare Other | Source: Ambulatory Visit | Attending: Nephrology | Admitting: Nephrology

## 2014-05-05 DIAGNOSIS — N184 Chronic kidney disease, stage 4 (severe): Secondary | ICD-10-CM | POA: Diagnosis not present

## 2014-05-05 DIAGNOSIS — D638 Anemia in other chronic diseases classified elsewhere: Secondary | ICD-10-CM | POA: Insufficient documentation

## 2014-05-05 LAB — COMPREHENSIVE METABOLIC PANEL
ALT: 8 U/L (ref 0–53)
ANION GAP: 17 — AB (ref 5–15)
AST: 11 U/L (ref 0–37)
Albumin: 3.5 g/dL (ref 3.5–5.2)
Alkaline Phosphatase: 64 U/L (ref 39–117)
BUN: 78 mg/dL — AB (ref 6–23)
CHLORIDE: 104 meq/L (ref 96–112)
CO2: 21 meq/L (ref 19–32)
CREATININE: 5.69 mg/dL — AB (ref 0.50–1.35)
Calcium: 8.4 mg/dL (ref 8.4–10.5)
GFR calc Af Amer: 10 mL/min — ABNORMAL LOW (ref >90)
GFR, EST NON AFRICAN AMERICAN: 8 mL/min — AB (ref >90)
Glucose, Bld: 113 mg/dL — ABNORMAL HIGH (ref 70–99)
Potassium: 3.9 mEq/L (ref 3.7–5.3)
Sodium: 142 mEq/L (ref 137–147)
Total Protein: 7.1 g/dL (ref 6.0–8.3)

## 2014-05-05 LAB — IRON AND TIBC
Iron: 61 ug/dL (ref 42–135)
Saturation Ratios: 23 % (ref 20–55)
TIBC: 262 ug/dL (ref 215–435)
UIBC: 201 ug/dL (ref 125–400)

## 2014-05-05 LAB — PHOSPHORUS: Phosphorus: 5.7 mg/dL — ABNORMAL HIGH (ref 2.3–4.6)

## 2014-05-05 LAB — FERRITIN: Ferritin: 57 ng/mL (ref 22–322)

## 2014-05-05 LAB — URIC ACID: Uric Acid, Serum: 5.3 mg/dL (ref 4.0–7.8)

## 2014-05-05 LAB — POCT HEMOGLOBIN-HEMACUE: Hemoglobin: 11 g/dL — ABNORMAL LOW (ref 13.0–17.0)

## 2014-05-05 MED ORDER — EPOETIN ALFA 10000 UNIT/ML IJ SOLN: 15000.0000 [IU] | INTRAMUSCULAR | Status: DC

## 2014-05-05 MED ORDER — EPOETIN ALFA 20000 UNIT/ML IJ SOLN
INTRAMUSCULAR | Status: AC
  Administered 2014-05-05: 20000 [IU] via SUBCUTANEOUS
  Filled 2014-05-05: qty 1

## 2014-05-06 LAB — PTH, INTACT AND CALCIUM
CALCIUM TOTAL (PTH): 8 mg/dL — AB (ref 8.4–10.5)
PTH: 167.4 pg/mL — ABNORMAL HIGH (ref 14.0–72.0)

## 2014-05-19 ENCOUNTER — Encounter (HOSPITAL_COMMUNITY)
Admission: RE | Admit: 2014-05-19 | Discharge: 2014-05-19 | Disposition: A | Discharge status: Home / Self Care | Payer: Medicare Other | Source: Ambulatory Visit | Attending: Nephrology | Admitting: Nephrology

## 2014-05-19 DIAGNOSIS — N189 Chronic kidney disease, unspecified: Secondary | ICD-10-CM | POA: Insufficient documentation

## 2014-05-19 DIAGNOSIS — R040 Epistaxis: Secondary | ICD-10-CM | POA: Diagnosis present

## 2014-05-19 DIAGNOSIS — I129 Hypertensive chronic kidney disease with stage 1 through stage 4 chronic kidney disease, or unspecified chronic kidney disease: Secondary | ICD-10-CM | POA: Insufficient documentation

## 2014-05-19 DIAGNOSIS — D638 Anemia in other chronic diseases classified elsewhere: Secondary | ICD-10-CM | POA: Diagnosis present

## 2014-05-19 DIAGNOSIS — N039 Chronic nephritic syndrome with unspecified morphologic changes: Principal | ICD-10-CM

## 2014-05-19 DIAGNOSIS — D631 Anemia in chronic kidney disease: Secondary | ICD-10-CM | POA: Diagnosis not present

## 2014-05-19 LAB — POCT HEMOGLOBIN-HEMACUE: Hemoglobin: 10.9 g/dL — ABNORMAL LOW (ref 13.0–17.0)

## 2014-05-19 MED ORDER — EPOETIN ALFA 20000 UNIT/ML IJ SOLN
INTRAMUSCULAR | Status: AC
  Filled 2014-05-19: qty 1

## 2014-05-19 MED ORDER — EPOETIN ALFA 10000 UNIT/ML IJ SOLN
15000.0000 [IU] | INTRAMUSCULAR | Status: DC
  Administered 2014-05-19: 15000 [IU] via SUBCUTANEOUS

## 2014-05-20 MED FILL — Epoetin Alfa Inj 20000 Unit/ML: INTRAMUSCULAR | Qty: 1 | Status: AC

## 2014-06-02 ENCOUNTER — Encounter (HOSPITAL_COMMUNITY)
Admission: RE | Admit: 2014-06-02 | Discharge: 2014-06-02 | Disposition: A | Discharge status: Home / Self Care | Payer: Medicare Other | Source: Ambulatory Visit | Attending: Nephrology | Admitting: Nephrology

## 2014-06-02 DIAGNOSIS — D638 Anemia in other chronic diseases classified elsewhere: Secondary | ICD-10-CM | POA: Diagnosis not present

## 2014-06-02 LAB — POCT HEMOGLOBIN-HEMACUE: HEMOGLOBIN: 10.9 g/dL — AB (ref 13.0–17.0)

## 2014-06-02 LAB — IRON AND TIBC
Iron: 57 ug/dL (ref 42–135)
Saturation Ratios: 22 % (ref 20–55)
TIBC: 262 ug/dL (ref 215–435)
UIBC: 205 ug/dL (ref 125–400)

## 2014-06-02 LAB — FERRITIN: FERRITIN: 71 ng/mL (ref 22–322)

## 2014-06-02 MED ORDER — EPOETIN ALFA 20000 UNIT/ML IJ SOLN
INTRAMUSCULAR | Status: AC
  Administered 2014-06-02: 20000 [IU] via SUBCUTANEOUS
  Filled 2014-06-02: qty 1

## 2014-06-02 MED ORDER — EPOETIN ALFA 10000 UNIT/ML IJ SOLN: 15000.0000 [IU] | INTRAMUSCULAR | Status: DC

## 2014-06-04 ENCOUNTER — Other Ambulatory Visit: Payer: Self-pay | Admitting: Family Medicine

## 2014-06-16 ENCOUNTER — Encounter (HOSPITAL_COMMUNITY)
Admission: RE | Admit: 2014-06-16 | Discharge: 2014-06-16 | Disposition: A | Discharge status: Home / Self Care | Payer: Medicare Other | Source: Ambulatory Visit | Attending: Nephrology | Admitting: Nephrology

## 2014-06-16 DIAGNOSIS — D638 Anemia in other chronic diseases classified elsewhere: Secondary | ICD-10-CM | POA: Insufficient documentation

## 2014-06-16 DIAGNOSIS — N184 Chronic kidney disease, stage 4 (severe): Secondary | ICD-10-CM | POA: Insufficient documentation

## 2014-06-16 LAB — POCT HEMOGLOBIN-HEMACUE: HEMOGLOBIN: 10.9 g/dL — AB (ref 13.0–17.0)

## 2014-06-16 MED ORDER — CLONIDINE HCL 0.1 MG PO TABS
ORAL_TABLET | ORAL | Status: AC
  Filled 2014-06-16: qty 1

## 2014-06-16 MED ORDER — CLONIDINE HCL 0.1 MG PO TABS
0.1000 mg | ORAL_TABLET | Freq: Once | ORAL | Status: AC
  Administered 2014-06-16 (×2): 0.1 mg via ORAL

## 2014-06-16 MED ORDER — EPOETIN ALFA 10000 UNIT/ML IJ SOLN: 15000.0000 [IU] | INTRAMUSCULAR | Status: DC

## 2014-06-16 MED ORDER — CLONIDINE HCL 0.1 MG PO TABS
ORAL_TABLET | ORAL | Status: AC
  Administered 2014-06-16: 0.1 mg via ORAL
  Filled 2014-06-16: qty 1

## 2014-06-16 NOTE — Progress Notes (Signed)
We rechecked pts Blood Pressure it was 198/67.  [facility] was called.  Dr Lorrene Reid gave orders to repeat Clonadine 0.1mg  and let PT go home and reschedule his appointment.  No shot was given.

## 2014-06-16 NOTE — Progress Notes (Signed)
Patient c/o head congestion, sore throat, left lower abdominal pain. Color pale, lower eyelids edematous. BP above parameters for today's scheduled injection. Patient states that he did not take his blood pressure meds this morning. Called [facility] and spoke with [place]. To speak with Dr. Lorrene Reid and call back.

## 2014-06-16 NOTE — Progress Notes (Signed)
Scott Holmes called back. Orders received per Dr Lorrene Reid. Patient verbalizes understanding via teachback. Clonidine given and hemocue drawn and run.

## 2014-06-21 ENCOUNTER — Encounter (HOSPITAL_COMMUNITY): Payer: Self-pay | Admitting: Emergency Medicine

## 2014-06-21 ENCOUNTER — Emergency Department (HOSPITAL_COMMUNITY): Payer: Medicare Other

## 2014-06-21 ENCOUNTER — Emergency Department (HOSPITAL_COMMUNITY)
Admission: EM | Admit: 2014-06-21 | Discharge: 2014-06-21 | Disposition: A | Discharge status: Home / Self Care | Payer: Medicare Other | Attending: Emergency Medicine | Admitting: Emergency Medicine

## 2014-06-21 DIAGNOSIS — E875 Hyperkalemia: Secondary | ICD-10-CM | POA: Insufficient documentation

## 2014-06-21 DIAGNOSIS — Z79899 Other long term (current) drug therapy: Secondary | ICD-10-CM | POA: Insufficient documentation

## 2014-06-21 DIAGNOSIS — E785 Hyperlipidemia, unspecified: Secondary | ICD-10-CM | POA: Insufficient documentation

## 2014-06-21 DIAGNOSIS — I10 Essential (primary) hypertension: Secondary | ICD-10-CM | POA: Insufficient documentation

## 2014-06-21 DIAGNOSIS — Z87891 Personal history of nicotine dependence: Secondary | ICD-10-CM | POA: Diagnosis not present

## 2014-06-21 DIAGNOSIS — R5381 Other malaise: Secondary | ICD-10-CM | POA: Diagnosis present

## 2014-06-21 DIAGNOSIS — Z880 Allergy status to penicillin: Secondary | ICD-10-CM | POA: Insufficient documentation

## 2014-06-21 DIAGNOSIS — Z7982 Long term (current) use of aspirin: Secondary | ICD-10-CM | POA: Insufficient documentation

## 2014-06-21 DIAGNOSIS — R5383 Other fatigue: Secondary | ICD-10-CM

## 2014-06-21 DIAGNOSIS — N179 Acute kidney failure, unspecified: Secondary | ICD-10-CM | POA: Insufficient documentation

## 2014-06-21 LAB — CBC WITH DIFFERENTIAL/PLATELET
Basophils Absolute: 0.1 10*3/uL (ref 0.0–0.1)
Basophils Relative: 1 % (ref 0–1)
EOS ABS: 0.1 10*3/uL (ref 0.0–0.7)
EOS PCT: 1 % (ref 0–5)
HCT: 31.5 % — ABNORMAL LOW (ref 39.0–52.0)
Hemoglobin: 10.6 g/dL — ABNORMAL LOW (ref 13.0–17.0)
LYMPHS PCT: 5 % — AB (ref 12–46)
Lymphs Abs: 0.7 10*3/uL (ref 0.7–4.0)
MCH: 32.4 pg (ref 26.0–34.0)
MCHC: 33.7 g/dL (ref 30.0–36.0)
MCV: 96.3 fL (ref 78.0–100.0)
Monocytes Absolute: 0.7 10*3/uL (ref 0.1–1.0)
Monocytes Relative: 5 % (ref 3–12)
NEUTROS PCT: 88 % — AB (ref 43–77)
Neutro Abs: 11.4 10*3/uL — ABNORMAL HIGH (ref 1.7–7.7)
Platelets: 273 10*3/uL (ref 150–400)
RBC: 3.27 MIL/uL — AB (ref 4.22–5.81)
RDW: 16.5 % — ABNORMAL HIGH (ref 11.5–15.5)
WBC: 13 10*3/uL — ABNORMAL HIGH (ref 4.0–10.5)

## 2014-06-21 LAB — COMPREHENSIVE METABOLIC PANEL
ALK PHOS: 65 U/L (ref 39–117)
ALT: 17 U/L (ref 0–53)
AST: 15 U/L (ref 0–37)
Albumin: 3.4 g/dL — ABNORMAL LOW (ref 3.5–5.2)
BUN: 176 mg/dL — ABNORMAL HIGH (ref 6–23)
Calcium: 7.8 mg/dL — ABNORMAL LOW (ref 8.4–10.5)
Chloride: 100 mEq/L (ref 96–112)
Creatinine, Ser: 21.3 mg/dL — ABNORMAL HIGH (ref 0.50–1.35)
GFR calc non Af Amer: 2 mL/min — ABNORMAL LOW (ref >90)
GFR, EST AFRICAN AMERICAN: 2 mL/min — AB (ref >90)
GLUCOSE: 155 mg/dL — AB (ref 70–99)
POTASSIUM: 6.8 meq/L — AB (ref 3.7–5.3)
SODIUM: 141 meq/L (ref 137–147)
TOTAL PROTEIN: 7.4 g/dL (ref 6.0–8.3)
Total Bilirubin: 0.3 mg/dL (ref 0.3–1.2)

## 2014-06-21 NOTE — ED Notes (Signed)
Report received from [place], [place]. Pt moved into rm C29--family at bedside. Waiting for palliative care consult. Daughter at bedside, will take pt home when discharged.

## 2014-06-21 NOTE — ED Notes (Signed)
CRITICAL VALUE ALERT  Critical value received:  Potassium 6.8, CO2 < 7   Date of notification:  06/21/14  Time of notification:  1305  Critical value read back: Yes  Nurse who received alert:  Delon Sacramento RN   MD notified (1st page):  Vanita Panda  Time of first page:  1306  MD notified (2nd page):  Time of second page:  Responding MD:  Vanita Panda  Time MD responded:  [phone]

## 2014-06-21 NOTE — Progress Notes (Signed)
ED CM consulted by [place] Nurse  Navigator concerning Home Hospice needs. Patient presented to [facility] ED c/o inability to void.  Patient has CKD and has refused to start on HD. Met with patient and daughter Ventura Bruns [phone] [facility]) at bedside, to discuss goals of care Coralyn Mark stated, she wants him to be comfortable. Offered choice, patient and family has already decided on [facility]. Referral called in to [place] [phone] then faxed in to [phone]. [facility] can get a nurse out tonight as per Volney Presser, patient and family declined rather someone come out to start service in the am. Address patient will be receiving care is [place].  [place], [place].   Volney Presser at intake was made aware. Patient and family reports he has a DNR but it is at home. Discussed with daughter we can have EDP provide a DNR form to transport him home. Patient and Daughter in agreement. Daughter states, she will transport patient home. Daughter given contact information for [facility] and myself, in the event additional questions or concerns should arise. Updated Dr. Christy Gentles and [facility] RN they are agreeable with plan. No further ED CM needs at this time

## 2014-06-21 NOTE — ED Notes (Signed)
Dr. Lockwood at the bedside. 

## 2014-06-21 NOTE — ED Notes (Signed)
Patient returned from X-ray 

## 2014-06-21 NOTE — ED Provider Notes (Signed)
CSN: [id number]     Arrival date & time 06/21/14  1020 History   First MD Initiated Contact with Patient 06/21/14 1028     Chief Complaint  Patient presents with  . Weakness  . Dysuria     (Consider location/radiation/quality/duration/timing/severity/associated sxs/prior Treatment) HPI Patient presents with concern generalized weakness, decreased urine output. Symptoms have been progressive for 4 weeks, notably worse over the past days. Patient denies recent precipitant of the worsening, states that he has had generalized discomfort for a long time, has previously been told he needs dialysis, but is not willing to begin a dialysis therapy. He currently denies fevers, chills, confusion, disorientation, vomiting, diarrhea. He denies dysuria as well.  Past Medical History  Diagnosis Date  . Hypertension   . Hyperlipidemia    History reviewed. No pertinent past surgical history. Family History  Problem Relation Age of Onset  . Heart disease Mother    History  Substance Use Topics  . Smoking status: Former Smoker    Quit date: 05/03/1994  . Smokeless tobacco: Never Used  . Alcohol Use: No    Review of Systems  Constitutional:       Per HPI, otherwise negative  HENT:       Per HPI, otherwise negative  Respiratory:       Per HPI, otherwise negative  Cardiovascular:       Per HPI, otherwise negative  Gastrointestinal: Negative for vomiting.  Endocrine:       Negative aside from HPI  Genitourinary:       Neg aside from HPI   Musculoskeletal:       Per HPI, otherwise negative  Skin: Negative.   Neurological: Positive for weakness. Negative for syncope.      Allergies  Penicillins  Home Medications   Prior to Admission medications   Medication Sig Start Date End Date Taking? Authorizing Provider  allopurinol (ZYLOPRIM) 100 MG tablet Take 50-100 mg by mouth daily. Take 1 tablet every other day, and take 1/2 tablet every other day.   Yes Historical Provider, MD   amLODipine (NORVASC) 5 MG tablet Take 0.5 tablets (2.5 mg total) by mouth daily. 07/28/13  Yes Willeen Niece, MD  Ascorbic Acid (VITAMIN C) 1000 MG tablet Take 1,000 mg by mouth 2 (two) times daily.   Yes Historical Provider, MD  aspirin 325 MG tablet Take 325 mg by mouth daily.   Yes Historical Provider, MD  carvedilol (COREG) 6.25 MG tablet Take 6.25 mg by mouth 2 (two) times daily with meals.     Yes Historical Provider, MD  Cyanocobalamin (VITAMIN B 12) 250 MCG LOZG Take 1 lozenge by mouth daily.    Yes Historical Provider, MD  ferrous sulfate 324 MG TBEC Take 324 mg by mouth 2 (two) times daily.     Yes Historical Provider, MD  furosemide (LASIX) 80 MG tablet Take 160 mg by mouth 2 (two) times daily. 07/04/12  Yes Willeen Niece, MD  gabapentin (NEURONTIN) 600 MG tablet Take 600 mg by mouth 2 (two) times daily.     Yes Historical Provider, MD  Garlic 1497 MG CAPS Take by mouth 2 (two) times daily.   Yes Historical Provider, MD  Lecithin 1200 MG CAPS Take by mouth 1 day or 1 dose.   Yes Historical Provider, MD  levocetirizine (XYZAL) 5 MG tablet Take 1 tablet (5 mg total) by mouth every evening. 11/20/13  Yes Willeen Niece, MD  Omega-3 Fatty Acids (OMEGA-3 FISH OIL) 1200 MG  CAPS Take by mouth 2 (two) times daily.   Yes Historical Provider, MD  omeprazole (PRILOSEC) 40 MG capsule TAKE 1 CAPSULE (40 MG TOTAL) BY MOUTH DAILY.   Yes Willeen Niece, MD  pentoxifylline (TRENTAL) 400 MG CR tablet TAKE 1 TABLET BY MOUTH 3 TIMES A DAY   Yes Willeen Niece, MD  rOPINIRole (REQUIP) 0.25 MG tablet TAKE 2 TABLETS BY MOUTH AT BEDTIME 09/04/13  Yes Willeen Niece, MD  sertraline (ZOLOFT) 25 MG tablet TAKE 1 TABLET (25 MG TOTAL) BY MOUTH DAILY. 06/04/14  Yes Alveda Reasons, MD  simvastatin (ZOCOR) 20 MG tablet Take 1 tablet (20 mg total) by mouth daily. 09/25/13  Yes Willeen Niece, MD  vitamin E 400 UNIT capsule Take 400 Units by mouth daily.   Yes Historical Provider, MD   BP 145/60  Pulse 60  Temp(Src) 97.4  F (36.3 C) (Oral)  Resp 19  SpO2 96% Physical Exam  Nursing note and vitals reviewed. Constitutional: He is oriented to person, place, and time. He appears well-developed. No distress.  HENT:  Head: Normocephalic and atraumatic.  Eyes: Conjunctivae and EOM are normal.  Cardiovascular: Normal rate and regular rhythm.   Pulmonary/Chest: Effort normal. No stridor. No respiratory distress.  Abdominal: He exhibits no distension. There is no tenderness.  Musculoskeletal: He exhibits edema.  Neurological: He is alert and oriented to person, place, and time.  Skin: Skin is warm and dry.  Psychiatric: He has a normal mood and affect.    ED Course  Procedures (including critical care time) Labs Review Labs Reviewed  CBC WITH DIFFERENTIAL - Abnormal; Notable for the following:    WBC 13.0 (*)    RBC 3.27 (*)    Hemoglobin 10.6 (*)    HCT 31.5 (*)    RDW 16.5 (*)    Neutrophils Relative % 88 (*)    Lymphocytes Relative 5 (*)    Neutro Abs 11.4 (*)    All other components within normal limits  COMPREHENSIVE METABOLIC PANEL - Abnormal; Notable for the following:    Potassium 6.8 (*)    CO2 <7 (*)    Glucose, Bld 155 (*)    BUN 176 (*)    Creatinine, Ser 21.30 (*)    Calcium 7.8 (*)    Albumin 3.4 (*)    GFR calc non Af Amer 2 (*)    GFR calc Af Amer 2 (*)    All other components within normal limits  URINALYSIS, ROUTINE W REFLEX MICROSCOPIC    Imaging Review Dg Chest 2 View  06/21/2014   CLINICAL DATA:  Increasing weakness over the past several weeks. Difficulty voiding.  EXAM: CHEST  2 VIEW  COMPARISON:  None.  FINDINGS: Trachea may be slightly deviated to the left, which can be seen with right thyroid enlargement. Heart is at the upper limits of normal in size. Thoracic aorta is calcified. Biapical pleural parenchymal scarring. Probable linear subpleural scarring at the right lung base. There may be tiny bilateral effusions.  IMPRESSION: Question tiny bilateral pleural  effusions.   Electronically Signed   By: Lorin Picket M.D.   On: 06/21/2014 11:46     EKG Interpretation   Date/Time:  Monday June 21 2014 10:44:10 EDT Ventricular Rate:  60 PR Interval:  187 QRS Duration: 166 QT Interval:  460 QTC Calculation: 460 R Axis:   -9 Text Interpretation:  Age not entered, assumed to be  80 years old for  purpose of ECG interpretation  Sinus rhythm Right bundle branch block Abnrm  T, consider ischemia, anterolateral lds Baseline wander in lead(s) V3  Sinus rhythm Non-specific intra-ventricular conduction delay T wave  abnormality Abnormal ekg Confirmed by Carmin Muskrat  MD [phone]) on  06/21/2014 11:53:08 AM     A review of patient's chart demonstrates that he has had conversations in the past with hospice, and has previously deferred recommendations for dialysis.  Update: Patient's labs notable for acute kidney failure, with hyperkalemia.  MDM   I had a lengthy conversation with the patient about all findings.  Patient defers recommendation for emergent dialysis and/or medications for his electrolyte abnormalities. I counseled him on the possibility of his imminent demise, which the patient seems comfortable with, and he is amenable to discussing his case with hospice care, but prefers no additional medical interventions.  1 patient has demonstrated capacity to make this decision. Patient's request will be accommodated, and he will be discharged, with home hospice followup.  Carmin Muskrat, MD 06/21/14 [phone]

## 2014-06-21 NOTE — ED Notes (Signed)
palliative care RN at bedside.

## 2014-06-21 NOTE — ED Notes (Signed)
Pt reports he's had increasing weakness over the last several weeks. Pt states he's been unable to void except for very little amounts. Reports taking medications as prescribed. States needs dialysis but has refused. States he has a DNR but its not with him. Pt awake, alert, oriented x4. VSS.

## 2014-06-21 NOTE — ED Notes (Signed)
Patient's friend speaking with Dr. Vanita Panda outside the room.

## 2014-06-21 NOTE — Discharge Instructions (Signed)
Please be sure to follow up with your primary care physician for additional consideration of these findings.

## 2014-06-21 NOTE — ED Notes (Signed)
Urine sample not collected

## 2014-06-25 ENCOUNTER — Telehealth: Payer: Self-pay | Admitting: Family Medicine

## 2014-06-25 ENCOUNTER — Ambulatory Visit: Payer: Medicare Other | Admitting: Family Medicine

## 2014-06-30 ENCOUNTER — Encounter (HOSPITAL_COMMUNITY): Payer: Medicare Other

## 2014-07-06 NOTE — Telephone Encounter (Signed)
I tried to call back to number listed, busy tone. Call to pt home [phone] I left a voice message at the home number.  Dalbert Mayotte, MD

## 2014-07-06 NOTE — Telephone Encounter (Signed)
Clarene Critchley called who is the caregiver for patient would like Dr. Lindell Noe to call her ASAP concerning Scott Holmes who is throwing up all his medication . He had an appointment this morning at 9:15 , but she are having a hard time getting him awake and up. Please call her at [phone]. Blima Rich

## 2014-07-06 DEATH — deceased

## 2014-07-14 ENCOUNTER — Encounter (HOSPITAL_COMMUNITY): Payer: Medicare Other

## 2014-08-03 ENCOUNTER — Telehealth: Payer: Self-pay | Admitting: *Deleted

## 2014-08-03 NOTE — Telephone Encounter (Signed)
Needs to speak with RN re: the certification paperwork, needs a little more description on what it needs to say, says she will explain to RN when they call back.

## 2014-08-03 NOTE — Telephone Encounter (Signed)
Left message on vm for Scott Holmes to return call.

## 2014-08-04 NOTE — Telephone Encounter (Signed)
Left another message for Candace.

## 2014-10-23 ENCOUNTER — Other Ambulatory Visit: Payer: Self-pay | Admitting: Nurse Practitioner

## 2015-05-16 NOTE — Progress Notes (Signed)
Pt is now deceased

## 2015-08-13 IMAGING — CR DG CHEST 2V
2 series · 2 of 2 positions shown · non-contrast
Comparison: None.

CLINICAL DATA: Increasing weakness over the past several weeks.
Difficulty voiding.

EXAM:
CHEST  2 VIEW

[w chest pa]
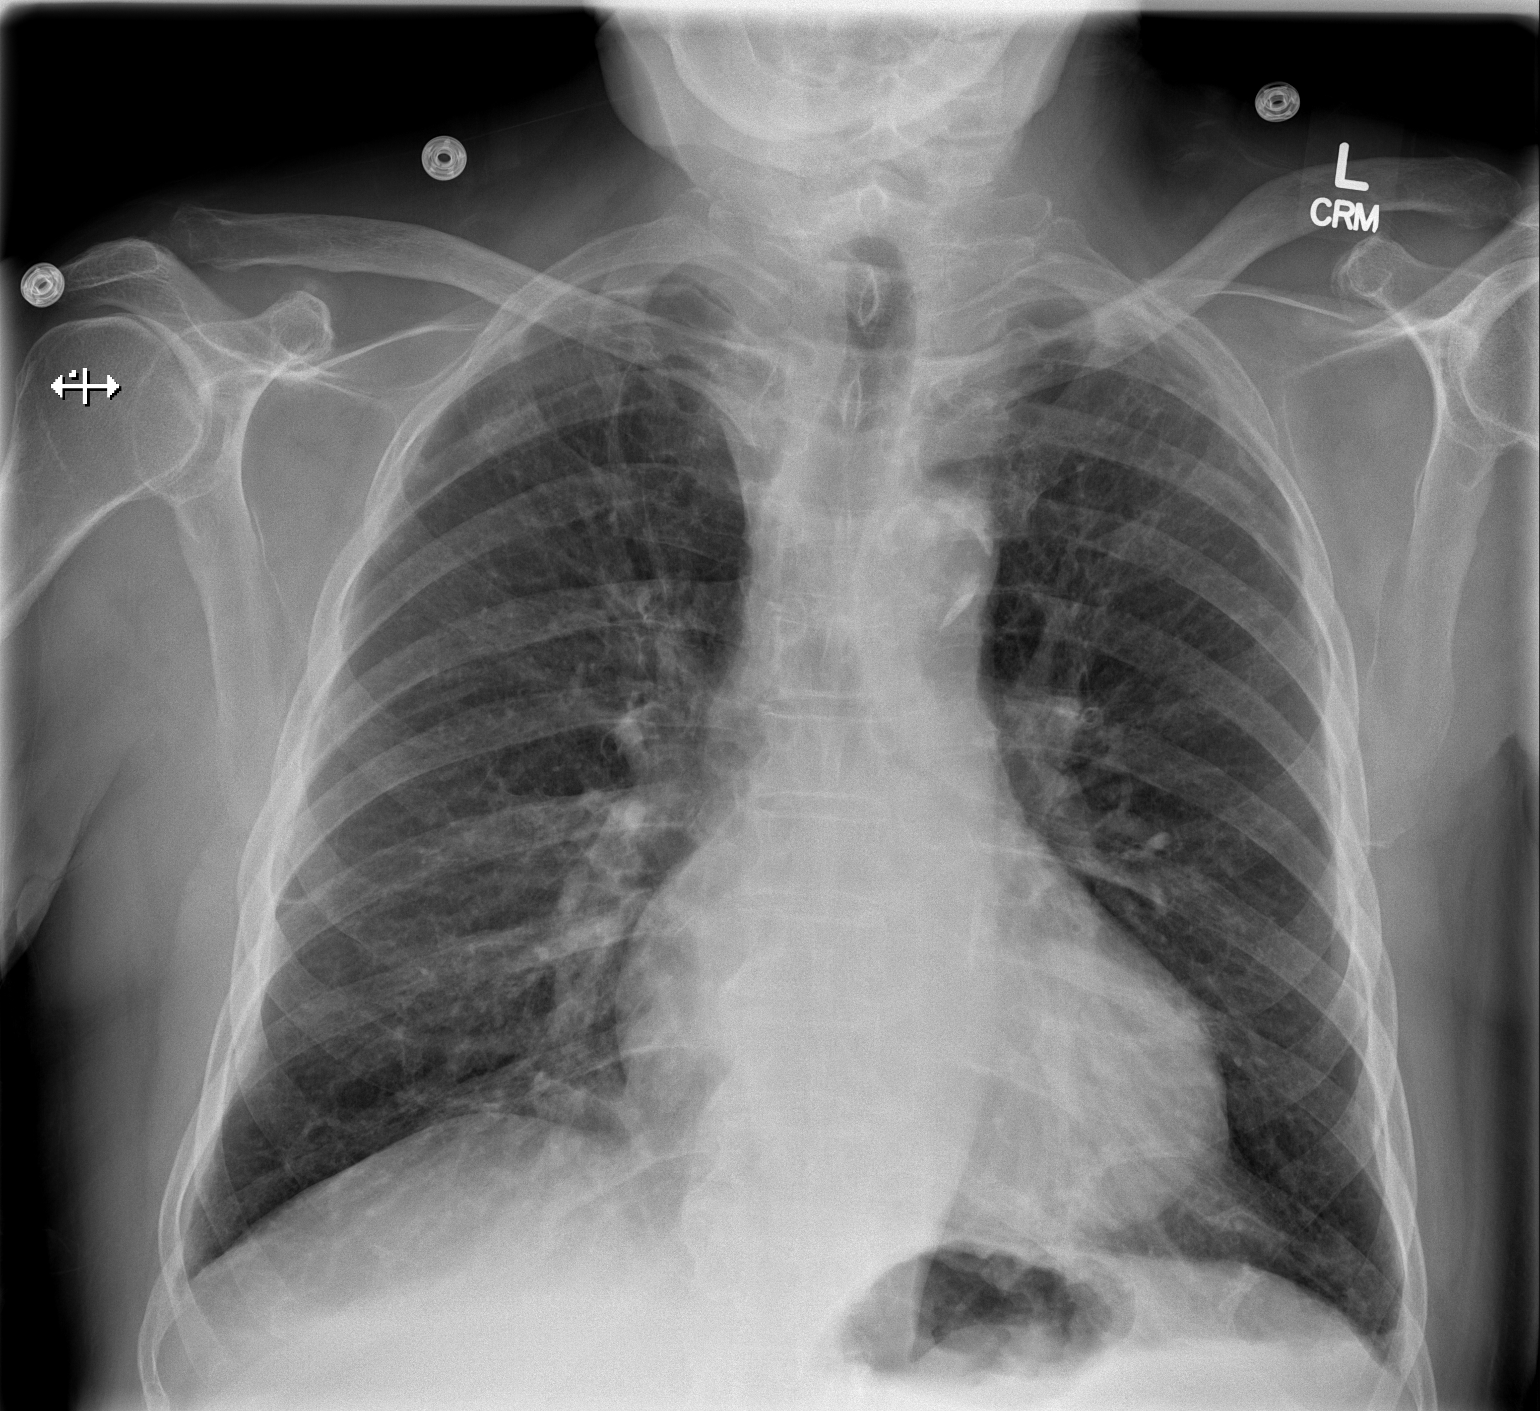

[w chest lat]
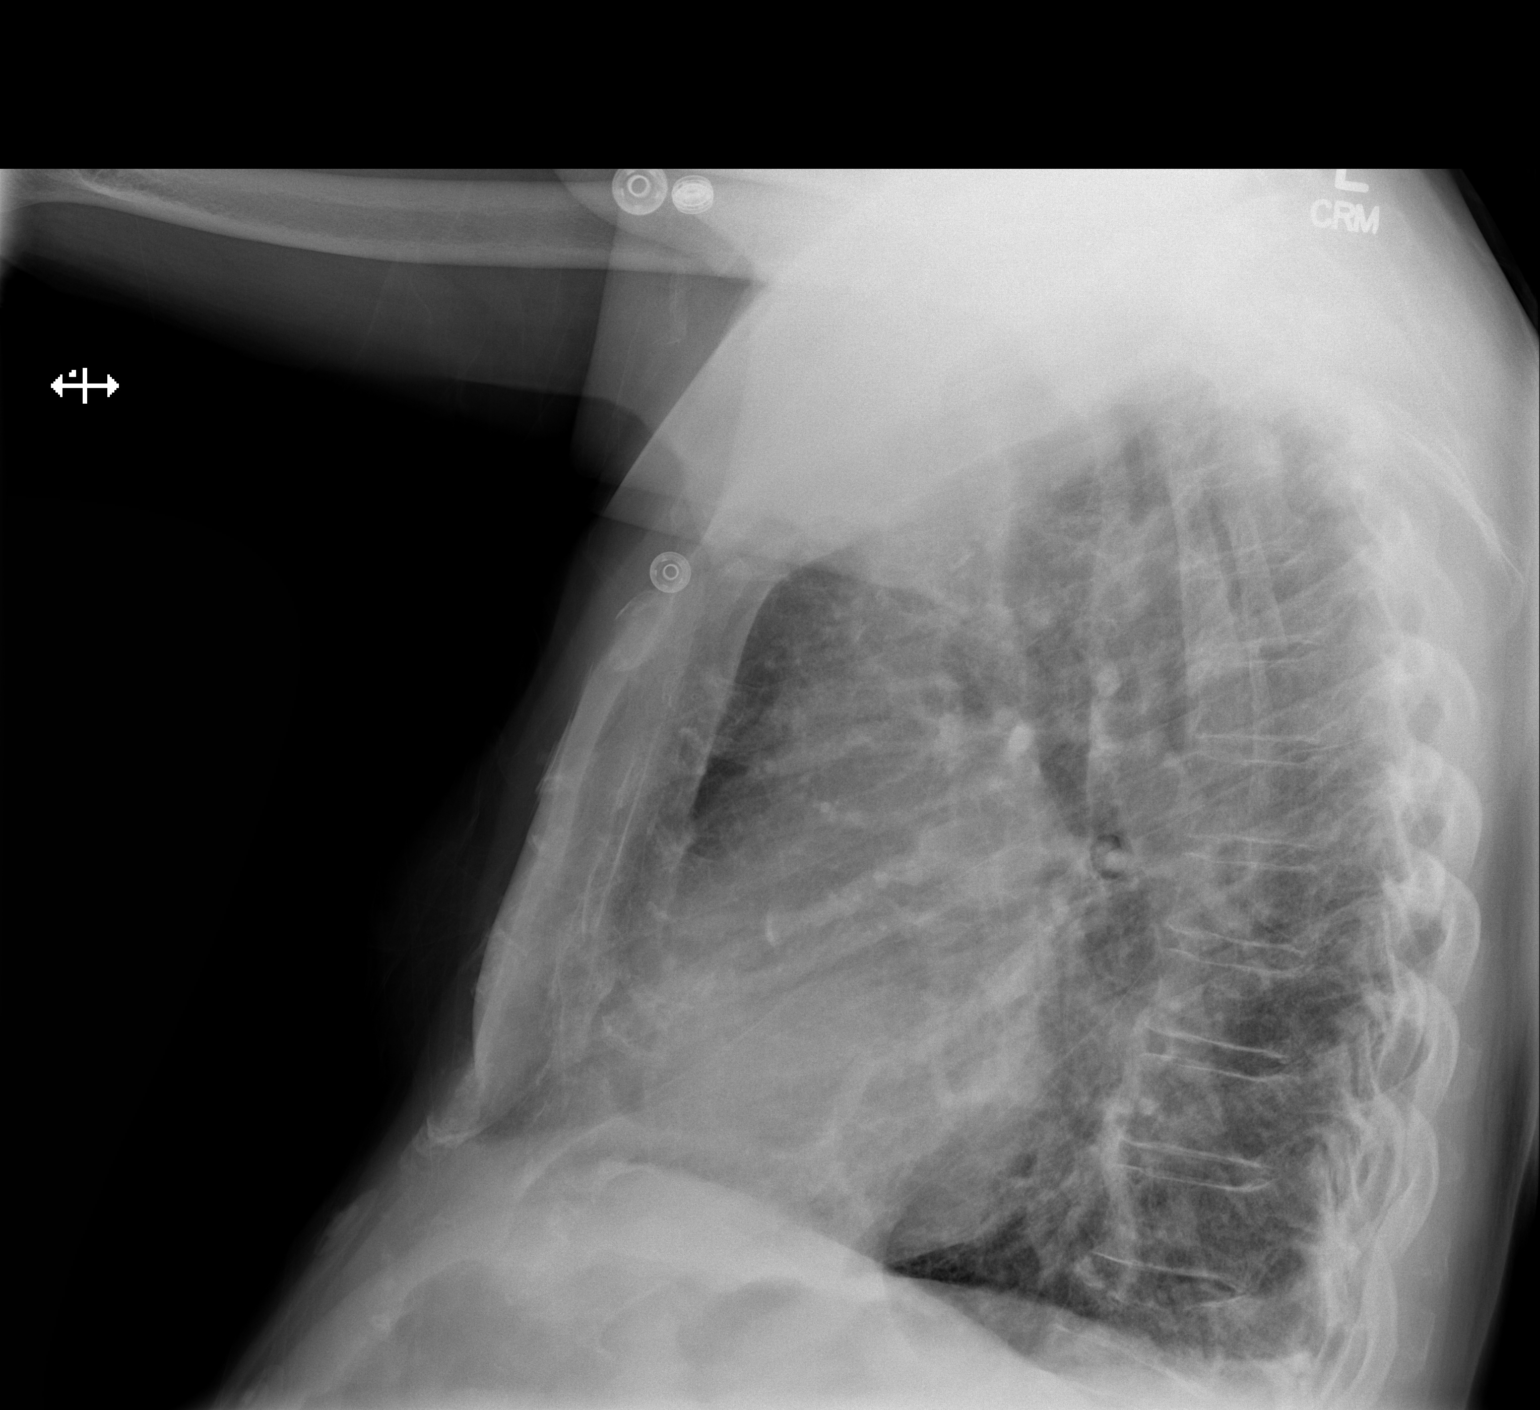

[2 of 2 positions shown; findings below may reference images not displayed]

FINDINGS: Trachea may be slightly deviated to the left, which can be seen with
right thyroid enlargement. Heart is at the upper limits of normal in
size. Thoracic aorta is calcified. Biapical pleural parenchymal
scarring. Probable linear subpleural scarring at the right lung
base. There may be tiny bilateral effusions.
IMPRESSION: Question tiny bilateral pleural effusions.
